# Patient Record
Sex: Female | Born: 1970 | Race: Black or African American | Hispanic: No | Marital: Married | Smoking: Never smoker
Health system: Southern US, Community
[De-identification: ages and names within clinical notes are randomized; demographics above are authoritative.]

## PROBLEM LIST (undated history)

## (undated) DIAGNOSIS — E669 Obesity, unspecified: Secondary | ICD-10-CM

## (undated) DIAGNOSIS — R9089 Other abnormal findings on diagnostic imaging of central nervous system: Secondary | ICD-10-CM

## (undated) DIAGNOSIS — F32A Depression, unspecified: Secondary | ICD-10-CM

## (undated) DIAGNOSIS — J309 Allergic rhinitis, unspecified: Secondary | ICD-10-CM

## (undated) DIAGNOSIS — G4700 Insomnia, unspecified: Secondary | ICD-10-CM

## (undated) DIAGNOSIS — N979 Female infertility, unspecified: Secondary | ICD-10-CM

## (undated) DIAGNOSIS — Q211 Atrial septal defect: Secondary | ICD-10-CM

## (undated) DIAGNOSIS — F329 Major depressive disorder, single episode, unspecified: Secondary | ICD-10-CM

## (undated) DIAGNOSIS — E785 Hyperlipidemia, unspecified: Secondary | ICD-10-CM

## (undated) DIAGNOSIS — M1990 Unspecified osteoarthritis, unspecified site: Secondary | ICD-10-CM

## (undated) DIAGNOSIS — Q2112 Patent foramen ovale: Secondary | ICD-10-CM

## (undated) DIAGNOSIS — N189 Chronic kidney disease, unspecified: Secondary | ICD-10-CM

## (undated) DIAGNOSIS — K219 Gastro-esophageal reflux disease without esophagitis: Secondary | ICD-10-CM

## (undated) DIAGNOSIS — E119 Type 2 diabetes mellitus without complications: Secondary | ICD-10-CM

## (undated) HISTORY — DX: Gastro-esophageal reflux disease without esophagitis: K21.9

## (undated) HISTORY — DX: Female infertility, unspecified: N97.9

## (undated) HISTORY — DX: Hyperlipidemia, unspecified: E78.5

## (undated) HISTORY — DX: Unspecified osteoarthritis, unspecified site: M19.90

## (undated) HISTORY — DX: Depression, unspecified: F32.A

## (undated) HISTORY — DX: Chronic kidney disease, unspecified: N18.9

## (undated) HISTORY — DX: Other abnormal findings on diagnostic imaging of central nervous system: R90.89

## (undated) HISTORY — DX: Insomnia, unspecified: G47.00

## (undated) HISTORY — DX: Patent foramen ovale: Q21.12

## (undated) HISTORY — DX: Allergic rhinitis, unspecified: J30.9

## (undated) HISTORY — DX: Type 2 diabetes mellitus without complications: E11.9

## (undated) HISTORY — DX: Major depressive disorder, single episode, unspecified: F32.9

## (undated) HISTORY — DX: Obesity, unspecified: E66.9

## (undated) HISTORY — DX: Atrial septal defect: Q21.1

---
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
## 1997-11-08 ENCOUNTER — Other Ambulatory Visit: Admission: RE | Admit: 1997-11-08 | Discharge: 1997-11-08 | Payer: Self-pay | Admitting: Obstetrics and Gynecology

## 1998-03-14 ENCOUNTER — Emergency Department (HOSPITAL_COMMUNITY): Admission: EM | Admit: 1998-03-14 | Discharge: 1998-03-14 | Payer: Self-pay | Admitting: Emergency Medicine

## 1998-03-14 ENCOUNTER — Encounter: Payer: Self-pay | Admitting: Emergency Medicine

## 1998-11-11 ENCOUNTER — Other Ambulatory Visit: Admission: RE | Admit: 1998-11-11 | Discharge: 1998-11-11 | Payer: Self-pay | Admitting: Obstetrics and Gynecology

## 1999-04-25 ENCOUNTER — Emergency Department (HOSPITAL_COMMUNITY): Admission: EM | Admit: 1999-04-25 | Discharge: 1999-04-25 | Payer: Self-pay | Admitting: Emergency Medicine

## 1999-12-16 ENCOUNTER — Other Ambulatory Visit: Admission: RE | Admit: 1999-12-16 | Discharge: 1999-12-16 | Payer: Self-pay | Admitting: *Deleted

## 2000-12-21 ENCOUNTER — Other Ambulatory Visit: Admission: RE | Admit: 2000-12-21 | Discharge: 2000-12-21 | Payer: Self-pay | Admitting: Obstetrics and Gynecology

## 2002-03-14 ENCOUNTER — Other Ambulatory Visit: Admission: RE | Admit: 2002-03-14 | Discharge: 2002-03-14 | Payer: Self-pay | Admitting: Obstetrics and Gynecology

## 2003-04-29 ENCOUNTER — Other Ambulatory Visit: Admission: RE | Admit: 2003-04-29 | Discharge: 2003-04-29 | Payer: Self-pay | Admitting: Obstetrics and Gynecology

## 2004-02-28 ENCOUNTER — Ambulatory Visit: Payer: Self-pay | Admitting: Internal Medicine

## 2005-03-25 ENCOUNTER — Ambulatory Visit: Payer: Self-pay | Admitting: Family Medicine

## 2005-05-25 ENCOUNTER — Ambulatory Visit: Payer: Self-pay | Admitting: Internal Medicine

## 2005-06-25 ENCOUNTER — Ambulatory Visit: Payer: Self-pay | Admitting: Internal Medicine

## 2006-03-14 ENCOUNTER — Ambulatory Visit: Payer: Self-pay | Admitting: Gastroenterology

## 2006-12-13 ENCOUNTER — Ambulatory Visit: Payer: Self-pay | Admitting: Internal Medicine

## 2006-12-13 DIAGNOSIS — J45909 Unspecified asthma, uncomplicated: Secondary | ICD-10-CM | POA: Insufficient documentation

## 2006-12-19 LAB — CONVERTED CEMR LAB
ALT: 14 units/L (ref 0–35)
AST: 22 units/L (ref 0–37)
BUN: 7 mg/dL (ref 6–23)
Basophils Absolute: 0 10*3/uL (ref 0.0–0.1)
Basophils Relative: 0.8 % (ref 0.0–1.0)
CO2: 31 meq/L (ref 19–32)
Calcium: 9.5 mg/dL (ref 8.4–10.5)
Chloride: 105 meq/L (ref 96–112)
Cholesterol: 203 mg/dL (ref 0–200)
Creatinine, Ser: 0.8 mg/dL (ref 0.4–1.2)
Direct LDL: 129.7 mg/dL
Eosinophils Absolute: 0.1 10*3/uL (ref 0.0–0.6)
Eosinophils Relative: 1.1 % (ref 0.0–5.0)
GFR calc Af Amer: 106 mL/min
GFR calc non Af Amer: 87 mL/min
Glucose, Bld: 82 mg/dL (ref 70–99)
HCT: 34.6 % — ABNORMAL LOW (ref 36.0–46.0)
Hemoglobin: 11.8 g/dL — ABNORMAL LOW (ref 12.0–15.0)
Lymphocytes Relative: 24.4 % (ref 12.0–46.0)
MCHC: 34.1 g/dL (ref 30.0–36.0)
MCV: 92.4 fL (ref 78.0–100.0)
Monocytes Absolute: 0.3 10*3/uL (ref 0.2–0.7)
Monocytes Relative: 5 % (ref 3.0–11.0)
Neutro Abs: 4.1 10*3/uL (ref 1.4–7.7)
Neutrophils Relative %: 68.7 % (ref 43.0–77.0)
Platelets: 309 10*3/uL (ref 150–400)
Potassium: 4.4 meq/L (ref 3.5–5.1)
RBC: 3.74 M/uL — ABNORMAL LOW (ref 3.87–5.11)
RDW: 12.1 % (ref 11.5–14.6)
Sodium: 141 meq/L (ref 135–145)
TSH: 1.47 microintl units/mL (ref 0.35–5.50)
WBC: 6 10*3/uL (ref 4.5–10.5)

## 2007-04-19 ENCOUNTER — Ambulatory Visit: Payer: Self-pay | Admitting: Internal Medicine

## 2007-04-19 DIAGNOSIS — F519 Sleep disorder not due to a substance or known physiological condition, unspecified: Secondary | ICD-10-CM | POA: Insufficient documentation

## 2007-04-20 DIAGNOSIS — F329 Major depressive disorder, single episode, unspecified: Secondary | ICD-10-CM

## 2007-09-11 LAB — CONVERTED CEMR LAB: Pap Smear: NORMAL

## 2008-03-22 ENCOUNTER — Telehealth (INDEPENDENT_AMBULATORY_CARE_PROVIDER_SITE_OTHER): Payer: Self-pay | Admitting: *Deleted

## 2008-09-11 ENCOUNTER — Ambulatory Visit: Payer: Self-pay | Admitting: Internal Medicine

## 2008-09-11 DIAGNOSIS — K219 Gastro-esophageal reflux disease without esophagitis: Secondary | ICD-10-CM

## 2008-09-18 ENCOUNTER — Ambulatory Visit: Payer: Self-pay | Admitting: Internal Medicine

## 2008-09-18 LAB — CONVERTED CEMR LAB: Hgb A1c MFr Bld: 6 % (ref 4.6–6.5)

## 2008-09-24 ENCOUNTER — Telehealth (INDEPENDENT_AMBULATORY_CARE_PROVIDER_SITE_OTHER): Payer: Self-pay | Admitting: *Deleted

## 2008-09-24 LAB — CONVERTED CEMR LAB
ALT: 17 units/L (ref 0–35)
AST: 24 units/L (ref 0–37)
BUN: 10 mg/dL (ref 6–23)
Basophils Absolute: 0 10*3/uL (ref 0.0–0.1)
Basophils Relative: 0.4 % (ref 0.0–3.0)
CO2: 28 meq/L (ref 19–32)
Calcium: 8.7 mg/dL (ref 8.4–10.5)
Chloride: 112 meq/L (ref 96–112)
Cholesterol: 185 mg/dL (ref 0–200)
Creatinine, Ser: 0.9 mg/dL (ref 0.4–1.2)
Eosinophils Absolute: 0.1 10*3/uL (ref 0.0–0.7)
Eosinophils Relative: 1.5 % (ref 0.0–5.0)
GFR calc non Af Amer: 91.11 mL/min (ref >60)
Glucose, Bld: 106 mg/dL — ABNORMAL HIGH (ref 70–99)
HCT: 32.7 % — ABNORMAL LOW (ref 36.0–46.0)
HDL: 44.7 mg/dL (ref >39.00)
Hemoglobin: 11.4 g/dL — ABNORMAL LOW (ref 12.0–15.0)
LDL Cholesterol: 122 mg/dL — ABNORMAL HIGH (ref 0–99)
Lymphocytes Relative: 24.2 % (ref 12.0–46.0)
Lymphs Abs: 1.1 10*3/uL (ref 0.7–4.0)
MCHC: 34.9 g/dL (ref 30.0–36.0)
MCV: 91.6 fL (ref 78.0–100.0)
Monocytes Absolute: 0.5 10*3/uL (ref 0.1–1.0)
Monocytes Relative: 10.5 % (ref 3.0–12.0)
Neutro Abs: 2.8 10*3/uL (ref 1.4–7.7)
Neutrophils Relative %: 63.4 % (ref 43.0–77.0)
Platelets: 280 10*3/uL (ref 150.0–400.0)
Potassium: 4 meq/L (ref 3.5–5.1)
RBC: 3.57 M/uL — ABNORMAL LOW (ref 3.87–5.11)
RDW: 13.2 % (ref 11.5–14.6)
Sodium: 141 meq/L (ref 135–145)
TSH: 2.09 microintl units/mL (ref 0.35–5.50)
Total CHOL/HDL Ratio: 4
Triglycerides: 94 mg/dL (ref 0.0–149.0)
VLDL: 18.8 mg/dL (ref 0.0–40.0)
WBC: 4.5 10*3/uL (ref 4.5–10.5)

## 2008-10-15 ENCOUNTER — Telehealth: Payer: Self-pay | Admitting: Internal Medicine

## 2008-10-21 ENCOUNTER — Ambulatory Visit: Payer: Self-pay | Admitting: Internal Medicine

## 2008-10-24 LAB — CONVERTED CEMR LAB
Ferritin: 55.9 ng/mL (ref 10.0–291.0)
Folate: 17.5 ng/mL
Iron: 53 ug/dL (ref 42–145)
Vitamin B-12: 430 pg/mL (ref 211–911)

## 2008-11-28 ENCOUNTER — Telehealth (INDEPENDENT_AMBULATORY_CARE_PROVIDER_SITE_OTHER): Payer: Self-pay | Admitting: *Deleted

## 2008-11-28 ENCOUNTER — Ambulatory Visit: Payer: Self-pay | Admitting: Family Medicine

## 2008-11-28 DIAGNOSIS — J019 Acute sinusitis, unspecified: Secondary | ICD-10-CM

## 2009-02-14 ENCOUNTER — Ambulatory Visit: Payer: Self-pay | Admitting: Family

## 2009-02-14 DIAGNOSIS — K5900 Constipation, unspecified: Secondary | ICD-10-CM | POA: Insufficient documentation

## 2009-02-14 DIAGNOSIS — R51 Headache: Secondary | ICD-10-CM

## 2009-02-14 DIAGNOSIS — R519 Headache, unspecified: Secondary | ICD-10-CM | POA: Insufficient documentation

## 2010-08-28 NOTE — Assessment & Plan Note (Signed)
[facility]                         GASTROENTEROLOGY OFFICE NOTE   NAME:[name], [name]                        MRN:          [id number]  DATE:03/14/2006                            DOB:          1970-10-14    REFERRING PHYSICIAN:  Sidney Ace, M.D. [facility]   REASON FOR REFERRAL:  Dr. [place] Callas asked me to evaluate [name] in  consultation regarding asthma, question GERD contributions to her  asthma.   HISTORY OF PRESENT ILLNESS:  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  She  describes pyrosis in her chest, working up to her mouth, an acid taste  in her mouth.  She has some dyspepsia.  She was put on Protonix  approximately two years ago and since then, her GERD symptoms have been  under very good control.  If she takes this regularly, she notices no  pyrosis or acid taste in her mouth.  She has no dysphagia.  She has  stopped the PPI occasionally for a few days or even up to a month at one  time and did notice that her GERD symptoms did begin to return.  She was  sent here by her allergist, wondering if some of her recent asthma  symptoms were extra-esophageal reflux related.   REVIEW OF SYSTEMS:  Notable for stable weight.  She fluctuates quite a  bit but over the past year is approximately stable.  The rest of her  review of systems is essentially normal and is available on her nursing  intake sheet.   PAST MEDICAL HISTORY:  Obesity.  Asthma.   CURRENT MEDICATIONS:  __________, phentermine, multivitamin, calcium,  Claritin, Singulair, Advair, iron, Ginkgo biloba.   SOCIAL HISTORY:  Single.  Lives by herself.  Works as a Database administrator.  Nonsmoker.  Drinks alcohol regularly.  Drinks one medium  [employer] coffee daily as well as 2-3 caffeinated teas daily.  Eats  quite a lot of peppermint on a daily basis and chocolate every other  day.   FAMILY HISTORY:  Father with heart  disease.  No colon cancer or colitis  in the family.   PHYSICAL EXAMINATION:  VITAL SIGNS:  Height 5 feet 5 inches.  Weight 279  pounds, which is a calculated BMI of 46.  Blood pressure 124/74, pulse  86.  CONSTITUTIONAL:  Morbidly obese, otherwise well-appearing.  NEUROLOGIC:  Alert and oriented x3.  HEENT:  Eyes:  Extraocular muscles are intact.  Mouth:  Oropharynx is  moist.  No lesions.  NECK:  Supple.  No lymphadenopathy.  CARDIOVASCULAR:  Heart has a regular rate and rhythm.  LUNGS:  Clear to auscultation bilaterally.  ABDOMEN:  Soft, nontender, nondistended.  Normal bowel sounds.  EXTREMITIES:  No lower extremity edema.  SKIN:  No rashes or lesions on visible extremities.   ASSESSMENT/PLAN:  A 40 year old woman with morbid obesity,  gastroesophageal reflux disease, question extra-esophageal  manifestations of gastroesophageal reflux disease.  [name] almost  certainly has gastroesophageal reflux disease, given her pyrosis, which  responds very  well to proton pump inhibitor taken on a daily basis.  Her  asthma has been worse lately, and there is question of whether this  represents an extra-esophageal manifestation of her gastroesophageal  reflux disease.  She does drink several caffeinated beverages a day and  eats a lot of peppermint, both of which can decrease a tone of lowered  esophageal sphincter, so I recommended that she try to cut back or stop  as best she can.  She also takes her Protonix, not at the perfect timing  in relation to food, so she will begin taking it 20-30 minutes prior to  her breakfast meal.  I explained that her obesity may also be  contributing to her gastroesophageal reflux disease.   I will arrange for her to have an EGD done as soon as convenient to see  if she has had any damage from her chronic GERD.  It can take 2-3 months  before noticing any difference in extra-esophageal manifestation after  complete acid control is gained.      Rachael Fee, MD  Electronically Signed    DPJ/MedQ  DD: 03/14/2006  DT: 03/15/2006  Job #: [id number]   cc:   Sidney Ace, M.D. [facility]

## 2010-10-26 ENCOUNTER — Ambulatory Visit (INDEPENDENT_AMBULATORY_CARE_PROVIDER_SITE_OTHER): Payer: BC Managed Care – PPO | Admitting: Internal Medicine

## 2010-10-26 ENCOUNTER — Encounter: Payer: Self-pay | Admitting: Internal Medicine

## 2010-10-26 DIAGNOSIS — Z0000 Encounter for general adult medical examination without abnormal findings: Secondary | ICD-10-CM

## 2010-10-26 NOTE — Progress Notes (Signed)
  Subjective:    Patient ID: Lauren Johns, female    DOB: 06-21-1970, 40 y.o.   MRN: [id number]  HPI CPX Got married 4- 2012, lost weight before wedding, now gaining it back but just restarted a healthier diet-exercise  Abnormal labs found during insurance physical: increased LFTs?? Cholesterol?  Past Medical History  Diagnosis Date  . Asthma   . Depression     h/o   . GERD (gastroesophageal reflux disease)   . Insomnia   . Obesity   . PFO (patent foramen ovale)     ??PFO repair:  in the 90s, was seen several times after and released (per patient)   Past Surgical History  Procedure Date  . Closed hearty valve repair 1993    [facility] , PFO repair??   Family History  Problem Relation Age of Onset  . Heart attack Father 46  . Diabetes Mother   . Hypertension Mother     M, F  . Prostate cancer Maternal Grandfather   . Colon cancer Neg Hx   . Breast cancer Neg Hx    History   Social History  . Marital Status: Single    Spouse Name: N/A    Number of Children: 0  . Years of Education: N/A   Occupational History  . school teacher     Social History Main Topics  . Smoking status: Never Smoker   . Smokeless tobacco: Not on file  . Alcohol Use: Yes     socially   . Drug Use: No  . Sexually Active: Not on file   Other Topics Concern  . Not on file   Social History Narrative   Exercise- 3 x a week --- diet: did very well before her recent weading, going back now to a healthy diet---mother is Lauren Johns, one of my patients      Review of Systems  Constitutional: Negative for fever and fatigue.  Respiratory: Negative for cough, shortness of breath and wheezing.   Cardiovascular: Negative for chest pain, palpitations and leg swelling.  Gastrointestinal: Negative for blood in stool and abdominal distention.  Genitourinary: Negative for hematuria and difficulty urinating.  Psychiatric/Behavioral:       No anxiety-depression       Objective:   Physical Exam  Constitutional: She is oriented to person, place, and time. She appears well-developed. No distress.       Overweight appearing  HENT:  Head: Normocephalic and atraumatic.  Eyes: No scleral icterus.  Neck: No thyromegaly present.  Cardiovascular: Normal rate and normal heart sounds.   No murmur heard. Pulmonary/Chest: Effort normal and breath sounds normal. No respiratory distress. She has no wheezes. She has no rales.  Abdominal: Soft. Bowel sounds are normal. She exhibits no distension. There is no tenderness. There is no rebound and no guarding.  Musculoskeletal: She exhibits no edema.  Neurological: She is alert and oriented to person, place, and time.  Skin: Skin is warm and dry. She is not diaphoretic.  Psychiatric: She has a normal mood and affect. Her behavior is normal. Judgment and thought content normal.          Assessment & Plan:

## 2010-10-26 NOTE — Assessment & Plan Note (Addendum)
Td 09 Recent abnormal labs per pt, rec to bring reports  Diet, Exericse! Sees gyn Schedule labs, See instructions

## 2010-10-26 NOTE — Patient Instructions (Signed)
Please came back fasting within1 week: FLP, CMP, TSH, CBC-----dx V70

## 2010-10-27 ENCOUNTER — Other Ambulatory Visit: Payer: Self-pay | Admitting: Internal Medicine

## 2010-10-27 DIAGNOSIS — Z0000 Encounter for general adult medical examination without abnormal findings: Secondary | ICD-10-CM

## 2010-10-28 ENCOUNTER — Other Ambulatory Visit (INDEPENDENT_AMBULATORY_CARE_PROVIDER_SITE_OTHER): Payer: BC Managed Care – PPO

## 2010-10-28 DIAGNOSIS — Z0000 Encounter for general adult medical examination without abnormal findings: Secondary | ICD-10-CM

## 2010-10-28 LAB — BASIC METABOLIC PANEL
CO2: 26 mEq/L (ref 19–32)
Calcium: 8.6 mg/dL (ref 8.4–10.5)
Chloride: 108 mEq/L (ref 96–112)
Creatinine, Ser: 0.9 mg/dL (ref 0.4–1.2)
Glucose, Bld: 95 mg/dL (ref 70–99)
Sodium: 141 mEq/L (ref 135–145)

## 2010-10-28 LAB — CBC WITH DIFFERENTIAL/PLATELET
Eosinophils Absolute: 0.1 10*3/uL (ref 0.0–0.7)
Eosinophils Relative: 1.3 % (ref 0.0–5.0)
HCT: 34.6 % — ABNORMAL LOW (ref 36.0–46.0)
Lymphs Abs: 1.1 10*3/uL (ref 0.7–4.0)
MCHC: 33.9 g/dL (ref 30.0–36.0)
MCV: 93.7 fl (ref 78.0–100.0)
Monocytes Absolute: 0.6 10*3/uL (ref 0.1–1.0)
Platelets: 255 10*3/uL (ref 150.0–400.0)
RDW: 14.4 % (ref 11.5–14.6)
WBC: 4.9 10*3/uL (ref 4.5–10.5)

## 2010-10-28 LAB — HEPATIC FUNCTION PANEL
ALT: 39 U/L — ABNORMAL HIGH (ref 0–35)
Albumin: 4.1 g/dL (ref 3.5–5.2)
Alkaline Phosphatase: 66 U/L (ref 39–117)
Total Protein: 7.3 g/dL (ref 6.0–8.3)

## 2010-10-28 LAB — LIPID PANEL
Cholesterol: 219 mg/dL — ABNORMAL HIGH (ref 0–200)
Triglycerides: 76 mg/dL (ref 0.0–149.0)

## 2010-10-28 LAB — TSH: TSH: 2.15 u[IU]/mL (ref 0.35–5.50)

## 2010-10-28 NOTE — Progress Notes (Signed)
Labs only

## 2010-11-03 ENCOUNTER — Telehealth: Payer: Self-pay | Admitting: *Deleted

## 2010-11-03 NOTE — Telephone Encounter (Signed)
Noted, will recheck when she comes back in 6 months

## 2010-11-03 NOTE — Telephone Encounter (Signed)
Message left for patient to return my call. Needs to come back for labs

## 2010-11-03 NOTE — Telephone Encounter (Signed)
Spoke w/ pt aware of labs and says that anemia isn't a new problem takes iron supplements daily.

## 2010-11-03 NOTE — Telephone Encounter (Signed)
Message copied by Leanne Lovely on Tue Nov 03, 2010 11:07 AM ------      Message from: Willow Ora E      Created: Mon Nov 02, 2010  5:48 PM       Advise patient      Her bad cholesterol is 172, high: The treatment for now is a healthy diet, daily exercise and lose weight. Recheck in 6 months.       She has anemia, please add iron, ferritin--- DX anemia      One of her LFTs (ALT) is slightly elevated, I see no reason to be concerned. We need to repeat yearly.      Again we need to recheck her FLP, please arrange for office visit in 6 months

## 2011-03-18 ENCOUNTER — Ambulatory Visit (INDEPENDENT_AMBULATORY_CARE_PROVIDER_SITE_OTHER): Payer: BC Managed Care – PPO

## 2011-03-18 DIAGNOSIS — Z23 Encounter for immunization: Secondary | ICD-10-CM

## 2011-05-10 ENCOUNTER — Other Ambulatory Visit: Payer: Self-pay | Admitting: Sports Medicine

## 2011-05-10 DIAGNOSIS — M5416 Radiculopathy, lumbar region: Secondary | ICD-10-CM

## 2011-05-13 ENCOUNTER — Ambulatory Visit
Admission: RE | Admit: 2011-05-13 | Discharge: 2011-05-13 | Disposition: A | Discharge status: Home / Self Care | Payer: BC Managed Care – PPO | Source: Ambulatory Visit | Attending: Sports Medicine | Admitting: Sports Medicine

## 2011-05-13 DIAGNOSIS — M5416 Radiculopathy, lumbar region: Secondary | ICD-10-CM

## 2011-10-11 ENCOUNTER — Ambulatory Visit (INDEPENDENT_AMBULATORY_CARE_PROVIDER_SITE_OTHER): Payer: BC Managed Care – PPO | Admitting: Internal Medicine

## 2011-10-11 ENCOUNTER — Encounter: Payer: Self-pay | Admitting: Internal Medicine

## 2011-10-11 VITALS — BP 118/82 | HR 73 | Temp 98.1°F | Ht 65.0 in | Wt 308.0 lb

## 2011-10-11 DIAGNOSIS — E669 Obesity, unspecified: Secondary | ICD-10-CM

## 2011-10-11 DIAGNOSIS — L708 Other acne: Secondary | ICD-10-CM

## 2011-10-11 DIAGNOSIS — L709 Acne, unspecified: Secondary | ICD-10-CM | POA: Insufficient documentation

## 2011-10-11 DIAGNOSIS — Q211 Atrial septal defect: Secondary | ICD-10-CM

## 2011-10-11 MED ORDER — CLINDAMYCIN PHOSPHATE 1 % EX GEL: Freq: Two times a day (BID) | CUTANEOUS | Status: AC

## 2011-10-11 NOTE — Patient Instructions (Addendum)
Come back fasting: FLP, CMP, TSH-- dx 278.00 Hemoglobin A1c-- dx  hyperglycemia

## 2011-10-11 NOTE — Assessment & Plan Note (Signed)
?   PFO repair in the 90s;  now facing possible surgery EKG at baseline Plan: Echocardiogram

## 2011-10-11 NOTE — Assessment & Plan Note (Signed)
Mild acne since she discontinued birth control pills. Prescribed Clindagel

## 2011-10-11 NOTE — Assessment & Plan Note (Signed)
Long history of obesity, in the past she has tried several diets with mixed results including Weight Watchers, personal trainers, protein shakes, "just cut down". Weight review 2008 281 pounds, 2009 275 pounds, 2002 181 pounds, 10/22/2011 293, today 308  Pounds She has mild hyperlipidemia and hyperglycemia. Plan: General labs including an A1c. Letter of support to surgery

## 2011-10-11 NOTE — Progress Notes (Signed)
  Subjective:    Patient ID: Lauren Johns, female    DOB: 20-Apr-1970, 41 y.o.   MRN: [id number]  HPI Here for the following issues: Obesity, thinking about having bariatric surgery, needs a letter completed for her surgeons. Also, recently discontinued birth control pills as she desires to get pregnant. Has noted acne coming back.  Past Medical History  Diagnosis Date  . Asthma   . Depression     h/o   . GERD (gastroesophageal reflux disease)   . Insomnia   . Obesity   . PFO (patent foramen ovale)     ??PFO repair:  in the 90s, was seen several times after and released (per patient)   Past Surgical History  Procedure Date  . Closed hearty valve repair 1993    [facility] , PFO repair??   History   Social History  . Marital Status: Married    Spouse Name: N/A    Number of Children: 0  . Years of Education: N/A   Occupational History  . school teacher     Social History Main Topics  . Smoking status: Never Smoker   . Smokeless tobacco: Never Used  . Alcohol Use: Yes     socially   . Drug Use: No  . Sexually Active: Not on file   Other Topics Concern  . Not on file   Social History Narrative   Exercise: just restarted to be active---Diet:healthier lately mother is Debera Lat, one of my patients    Family History  Problem Relation Age of Onset  . Heart attack Father 71  . Diabetes Mother   . Hypertension Mother     M, F  . Prostate cancer Maternal Grandfather   . Colon cancer Neg Hx   . Breast cancer Neg Hx       Review of Systems In general feeling well. As far as her diet, has not been good up until recently when she tried to eat healthier. Exercise, she has been pretty inactive until today when she started back exercising with a trainer. Denies chest pain or shortness of breath Denies nausea, vomiting, diarrhea. No blood in the stools. No anxiety depression.     Objective:   Physical Exam  General -- alert, well-developed, and morbidly  overweight . No apparent distress.  Neck --no thyromegaly HEENT --  few acne lesions at the forehead and between the eyes. Lungs -- normal respiratory effort, no intercostal retractions, no accessory muscle use, and normal breath sounds.   Heart-- normal rate, regular rhythm, no murmur, and no gallop.   Abdomen--soft, non-tender, no distention, no masses, no HSM, no guarding, and no rigidity.   Extremities-- no pretibial edema bilaterally  Neurologic-- alert & oriented X3 and strength normal in all extremities. Psych-- Cognition and judgment appear intact. Alert and cooperative with normal attention span and concentration.  not anxious appearing and not depressed appearing.       Assessment & Plan:

## 2011-10-12 ENCOUNTER — Telehealth: Payer: Self-pay | Admitting: Internal Medicine

## 2011-10-12 NOTE — Telephone Encounter (Signed)
Called, no MD available

## 2011-10-12 NOTE — Telephone Encounter (Signed)
In reference to order for Echo, [employer] is requiring a Peer to Peer review.  Please call [phone], Option 2, ref ID [id number].

## 2011-10-13 ENCOUNTER — Other Ambulatory Visit: Payer: Self-pay | Admitting: *Deleted

## 2011-10-13 DIAGNOSIS — Q211 Atrial septal defect: Secondary | ICD-10-CM

## 2011-10-13 NOTE — Telephone Encounter (Signed)
authrozation #: [id number]

## 2011-10-13 NOTE — Addendum Note (Signed)
Addended by: Edwena Felty T on: 10/13/2011 03:45 PM   Modules accepted: Orders

## 2011-10-15 ENCOUNTER — Other Ambulatory Visit (INDEPENDENT_AMBULATORY_CARE_PROVIDER_SITE_OTHER): Payer: BC Managed Care – PPO

## 2011-10-15 DIAGNOSIS — R739 Hyperglycemia, unspecified: Secondary | ICD-10-CM

## 2011-10-15 DIAGNOSIS — R7309 Other abnormal glucose: Secondary | ICD-10-CM

## 2011-10-15 DIAGNOSIS — E669 Obesity, unspecified: Secondary | ICD-10-CM

## 2011-10-15 LAB — COMPREHENSIVE METABOLIC PANEL
AST: 23 U/L (ref 0–37)
Albumin: 4 g/dL (ref 3.5–5.2)
Alkaline Phosphatase: 66 U/L (ref 39–117)
BUN: 13 mg/dL (ref 6–23)
Calcium: 9.5 mg/dL (ref 8.4–10.5)
Chloride: 106 mEq/L (ref 96–112)
Glucose, Bld: 96 mg/dL (ref 70–99)
Potassium: 4 mEq/L (ref 3.5–5.1)
Sodium: 141 mEq/L (ref 135–145)
Total Protein: 7.3 g/dL (ref 6.0–8.3)

## 2011-10-15 LAB — HEMOGLOBIN A1C: Hgb A1c MFr Bld: 6.2 % (ref 4.6–6.5)

## 2011-10-15 LAB — TSH: TSH: 2.03 u[IU]/mL (ref 0.35–5.50)

## 2011-10-15 NOTE — Progress Notes (Signed)
Labs only

## 2011-10-20 ENCOUNTER — Encounter: Payer: Self-pay | Admitting: Internal Medicine

## 2011-10-20 ENCOUNTER — Other Ambulatory Visit (HOSPITAL_COMMUNITY): Payer: BC Managed Care – PPO

## 2011-10-20 ENCOUNTER — Encounter: Payer: Self-pay | Admitting: *Deleted

## 2011-11-02 ENCOUNTER — Other Ambulatory Visit (HOSPITAL_COMMUNITY): Payer: BC Managed Care – PPO

## 2011-11-08 ENCOUNTER — Ambulatory Visit (HOSPITAL_COMMUNITY): Payer: BC Managed Care – PPO | Attending: Cardiology

## 2011-11-08 DIAGNOSIS — I059 Rheumatic mitral valve disease, unspecified: Secondary | ICD-10-CM | POA: Insufficient documentation

## 2011-11-08 DIAGNOSIS — I079 Rheumatic tricuspid valve disease, unspecified: Secondary | ICD-10-CM | POA: Insufficient documentation

## 2011-11-08 DIAGNOSIS — E669 Obesity, unspecified: Secondary | ICD-10-CM | POA: Insufficient documentation

## 2011-11-08 DIAGNOSIS — Q2111 Secundum atrial septal defect: Secondary | ICD-10-CM

## 2011-11-08 DIAGNOSIS — Q211 Atrial septal defect: Secondary | ICD-10-CM | POA: Insufficient documentation

## 2011-11-08 DIAGNOSIS — I319 Disease of pericardium, unspecified: Secondary | ICD-10-CM | POA: Insufficient documentation

## 2011-11-08 DIAGNOSIS — I379 Nonrheumatic pulmonary valve disorder, unspecified: Secondary | ICD-10-CM | POA: Insufficient documentation

## 2011-11-08 NOTE — Progress Notes (Signed)
Echocardiogram performed.  

## 2011-11-09 ENCOUNTER — Encounter: Payer: Self-pay | Admitting: *Deleted

## 2011-11-17 ENCOUNTER — Ambulatory Visit (INDEPENDENT_AMBULATORY_CARE_PROVIDER_SITE_OTHER): Payer: BC Managed Care – PPO | Admitting: General Surgery

## 2011-11-17 ENCOUNTER — Encounter (INDEPENDENT_AMBULATORY_CARE_PROVIDER_SITE_OTHER): Payer: Self-pay | Admitting: General Surgery

## 2011-11-17 VITALS — BP 132/68 | HR 64 | Temp 97.2°F | Resp 16 | Ht 65.0 in | Wt 306.4 lb

## 2011-11-17 DIAGNOSIS — Z6841 Body Mass Index (BMI) 40.0 and over, adult: Secondary | ICD-10-CM

## 2011-11-17 NOTE — Progress Notes (Signed)
Patient ID: Lauren Johns, female   DOB: [birth year], 41 y.o.   MRN: [id number]  Chief Complaint  Patient presents with  . Other    lap band initial    HPI Lauren Johns is a 41 y.o. female.   HPI 41 year old morbidly obese African American female referred by Dr. Drue Novel for evaluation for weight loss surgery. The patient is specifically interested in laparoscopic adjustable gastric band surgery. She likes the fact that the weight loss is more gradual. Moreover she has several friends who had complications with gastric bypass surgery. She states that she is from all of her life to  Lose weight. She has tried to [employer], [employer]ystem, [employer], as well as a low calorie diet call without any long-term success. Her most successful program was with [employer] in high school when she lost approximately 60 pounds. She also has been on phentermine for one year without any success. She has recently signed up for boot camp which is going to be starting up next week.   Past Medical History  Diagnosis Date  . Asthma   . Depression     h/o   . GERD (gastroesophageal reflux disease)   . Insomnia   . Obesity   . PFO (patent foramen ovale)     ??PFO repair:  in the 90s, was seen several times after and released (per patient)  . Arthritis   . Chronic kidney disease - not sure of this dx - has nml Cr on recent labs   . Elevated blood sugar   . Elevated lipids     Past Surgical History  Procedure Date  . Closed hearty valve repair 1993    [facility] , PFO repair??    Family History  Problem Relation Age of Onset  . Heart attack Father 46  . Heart disease Father   . Diabetes Mother   . Hypertension Mother     M, F  . Prostate cancer Maternal Grandfather   . Cancer Maternal Grandfather     colon  . Colon cancer Neg Hx   . Breast cancer Neg Hx     Social History History  Substance Use Topics  . Smoking status: Never Smoker   . Smokeless tobacco: Never Used  . Alcohol Use: Yes   socially     Allergies  Allergen Reactions  . Penicillins     REACTION: HIVES    Current Outpatient Prescriptions  Medication Sig Dispense Refill  . acetaminophen (TYLENOL) 500 MG tablet Take 1,000 mg by mouth every 6 (six) hours as needed.        . clindamycin (CLINDAGEL) 1 % gel Apply topically 2 (two) times daily.  30 g  3  . QVAR 80 MCG/ACT inhaler Inhale 80 mcg into the lungs Daily.      . SYMBICORT 80-4.5 MCG/ACT inhaler       . tretinoin (RETIN-A) 0.025 % cream       . VENTOLIN HFA 108 (90 BASE) MCG/ACT inhaler Inhale 90 mcg into the lungs Daily.        Review of Systems Review of Systems  Constitutional: Negative for fever, activity change, appetite change, fatigue and unexpected weight change.       A1C was 6.2  HENT: Positive for postnasal drip. Negative for hearing loss, nosebleeds and neck pain.   Eyes: Negative for photophobia, redness and visual disturbance.  Respiratory: Negative for apnea, chest tightness, shortness of breath and wheezing.  Uses inhaler daily - no hospitalizations for asthma  Cardiovascular: Negative for chest pain, palpitations and leg swelling.       Denies CP, SOB, PND, orthopnea. Sometimes asthma will flare causing SOB. Had a patent 'heart valve' - repaired via groin.   Gastrointestinal: Negative for nausea, vomiting, abdominal pain, diarrhea and constipation.       Rare reflux  Genitourinary: Positive for menstrual problem (crampy, heavy flow since stopped OCP). Negative for dysuria, hematuria and difficulty urinating.       G0P0  Musculoskeletal: Negative for myalgias and back pain.       Bulging disc in back seen on MRI  Neurological: Negative for tremors, seizures, speech difficulty, weakness, light-headedness and headaches.       Denies TIA, amaurosis fugax  Hematological: Negative for adenopathy. Does not bruise/bleed easily.       Low grade anemia on recent blood work - PCP thinks due to menses  Psychiatric/Behavioral: Negative  for confusion. The patient is not nervous/anxious.     Blood pressure 132/68, pulse 64, temperature 97.2 F (36.2 C), temperature source Temporal, resp. rate 16, height 5\' 5"  (1.651 m), weight 306 lb 6.4 oz (138.982 kg), last menstrual period 11/12/2011.  Physical Exam Physical Exam  Vitals reviewed. Constitutional: She is oriented to person, place, and time. She appears well-developed and well-nourished. No distress.       Morbidly obese  HENT:  Head: Normocephalic and atraumatic.  Right Ear: External ear normal.  Left Ear: External ear normal.  Nose: Nose normal.  Eyes: Conjunctivae are normal. No scleral icterus.  Neck: Normal range of motion. Neck supple. No tracheal deviation present. No thyromegaly present.  Cardiovascular: Normal rate, regular rhythm, normal heart sounds and intact distal pulses.   Pulmonary/Chest: Effort normal and breath sounds normal. No respiratory distress. She has no wheezes.  Abdominal: Soft. Bowel sounds are normal. She exhibits no distension. There is no tenderness. There is no guarding.       obese  Musculoskeletal: Normal range of motion. She exhibits no edema and no tenderness.  Lymphadenopathy:    She has no cervical adenopathy.  Neurological: She is alert and oriented to person, place, and time. No cranial nerve deficit.  Skin: Skin is warm and dry. No rash noted. She is not diaphoretic. No erythema.  Psychiatric: She has a normal mood and affect. Her behavior is normal. Judgment and thought content normal.    Data Reviewed Dr Drue Novel note Labs from 10/2011 including lipid panel, CMET, CBC, TSH, A1C - all normal except for hgb 11.7, hct 34.6; A1C 6.2; Total cholesterol 234, TG 136 MRI spine - mild DDD L spine, probable uterine fibroid  Assessment    Morbid obesity BMI 51 Asthma Anemia Elevated Triglycerides Elevated lipids Elevated blood sugar Mild DDD of Lumbar spine     Plan    The patient meets weight loss surgery criteria. I think  the patient would be an acceptable candidate for Laparoscopic adjustable gastric band placement.  We discussed laparoscopic adjustable gastric banding. The patient was given Agricultural engineer. We discussed the risk and benefits of surgery including but not limited to bleeding, infection, injury to surrounding structures, blood clot formation such as deep venous thrombosis or pulmonary embolism, need to convert to an open procedure, band slippage, band erosion, failure to loose weight, port complications (leak or flippage), potential need for reoperative surgery, esophageal dilatation, worsening reflux, and vitamin deficiencies. We discussed the typical post operative recovery course. We discussed that their postoperative  diet will be modified for several weeks. We specifically talked about the need to be on a liquid diet for one to 2 weeks after surgery. We also discussed the typical postoperative course with a laparoscopic adjustable gastric band and the need for frequent postoperative visits to assess the volume status of the band.  We discussed the typical expected weight loss with a laparoscopic adjustable gastric band. I explained to the patient that they can expect to lose 40-60% of their excess body weight if they are compliant with their postoperative instructions. However I did explain that some patients loose less than 40% and some patients lose more than 60% of their excess body weight.  I explained that the likelihood of improvement in their obesity is good.  I explained to the patient that we will start our evaluation process which includes labs (pregnancy, & H pylori), Upper GI to evaluate stomach and swallowing anatomy, nutritionist consultation, psychiatrist consultation, EKG, CXR, abdominal ultrasound.  Mary Sella. Andrey Campanile, MD, FACS General, Bariatric, & Minimally Invasive Surgery [facility], [place]         [facility] M 11/17/2011, 12:56 PM

## 2011-11-17 NOTE — Patient Instructions (Signed)
We will start our work-up for LapBand Surgery

## 2011-12-02 ENCOUNTER — Ambulatory Visit: Payer: BC Managed Care – PPO | Admitting: *Deleted

## 2011-12-03 ENCOUNTER — Ambulatory Visit (HOSPITAL_COMMUNITY): Admission: RE | Admit: 2011-12-03 | Payer: BC Managed Care – PPO | Source: Ambulatory Visit

## 2011-12-03 ENCOUNTER — Ambulatory Visit (HOSPITAL_COMMUNITY): Payer: BC Managed Care – PPO

## 2011-12-03 ENCOUNTER — Ambulatory Visit (HOSPITAL_COMMUNITY): Payer: BC Managed Care – PPO | Attending: General Surgery

## 2012-05-13 ENCOUNTER — Emergency Department (HOSPITAL_BASED_OUTPATIENT_CLINIC_OR_DEPARTMENT_OTHER): Payer: BC Managed Care – PPO

## 2012-05-13 ENCOUNTER — Emergency Department (HOSPITAL_BASED_OUTPATIENT_CLINIC_OR_DEPARTMENT_OTHER)
Admission: EM | Admit: 2012-05-13 | Discharge: 2012-05-13 | Disposition: A | Discharge status: Home / Self Care | Payer: BC Managed Care – PPO | Attending: Emergency Medicine | Admitting: Emergency Medicine

## 2012-05-13 ENCOUNTER — Encounter (HOSPITAL_BASED_OUTPATIENT_CLINIC_OR_DEPARTMENT_OTHER): Payer: Self-pay | Admitting: *Deleted

## 2012-05-13 DIAGNOSIS — E669 Obesity, unspecified: Secondary | ICD-10-CM | POA: Insufficient documentation

## 2012-05-13 DIAGNOSIS — J45901 Unspecified asthma with (acute) exacerbation: Secondary | ICD-10-CM | POA: Insufficient documentation

## 2012-05-13 DIAGNOSIS — R059 Cough, unspecified: Secondary | ICD-10-CM | POA: Insufficient documentation

## 2012-05-13 DIAGNOSIS — Z8719 Personal history of other diseases of the digestive system: Secondary | ICD-10-CM | POA: Insufficient documentation

## 2012-05-13 DIAGNOSIS — R05 Cough: Secondary | ICD-10-CM | POA: Insufficient documentation

## 2012-05-13 DIAGNOSIS — J40 Bronchitis, not specified as acute or chronic: Secondary | ICD-10-CM

## 2012-05-13 DIAGNOSIS — J45909 Unspecified asthma, uncomplicated: Secondary | ICD-10-CM

## 2012-05-13 DIAGNOSIS — Z8774 Personal history of (corrected) congenital malformations of heart and circulatory system: Secondary | ICD-10-CM | POA: Insufficient documentation

## 2012-05-13 DIAGNOSIS — Z79899 Other long term (current) drug therapy: Secondary | ICD-10-CM | POA: Insufficient documentation

## 2012-05-13 DIAGNOSIS — N189 Chronic kidney disease, unspecified: Secondary | ICD-10-CM | POA: Insufficient documentation

## 2012-05-13 DIAGNOSIS — Z8659 Personal history of other mental and behavioral disorders: Secondary | ICD-10-CM | POA: Insufficient documentation

## 2012-05-13 DIAGNOSIS — R509 Fever, unspecified: Secondary | ICD-10-CM | POA: Insufficient documentation

## 2012-05-13 DIAGNOSIS — Z8739 Personal history of other diseases of the musculoskeletal system and connective tissue: Secondary | ICD-10-CM | POA: Insufficient documentation

## 2012-05-13 DIAGNOSIS — IMO0002 Reserved for concepts with insufficient information to code with codable children: Secondary | ICD-10-CM | POA: Insufficient documentation

## 2012-05-13 MED ORDER — PREDNISONE 20 MG PO TABS: ORAL_TABLET | ORAL | Status: DC

## 2012-05-13 MED ORDER — ALBUTEROL SULFATE (5 MG/ML) 0.5% IN NEBU
2.5000 mg | INHALATION_SOLUTION | RESPIRATORY_TRACT | Status: DC
  Administered 2012-05-13: 2.5 mg via RESPIRATORY_TRACT
  Filled 2012-05-13: qty 1

## 2012-05-13 MED ORDER — IPRATROPIUM BROMIDE 0.02 % IN SOLN
0.5000 mg | RESPIRATORY_TRACT | Status: DC
  Administered 2012-05-13: 0.5 mg via RESPIRATORY_TRACT
  Filled 2012-05-13: qty 2.5

## 2012-05-13 MED ORDER — HYDROCODONE-HOMATROPINE 5-1.5 MG/5ML PO SYRP: 5.0000 mL | ORAL_SOLUTION | Freq: Four times a day (QID) | ORAL | Status: DC | PRN

## 2012-05-13 NOTE — ED Notes (Signed)
C/o sore throat that started on Thursday. Was seen Friday at an urgent care and dx with a sinus infection with asthma exacerbation. States no cxray was done. States she has had fevers on and off. Has been treated with zithromax and tessalon perle. States this has not helped with her cough. C/o feeing sob today. States inhaler has not been helping. Faint exp wheezing noted on exam.

## 2012-05-13 NOTE — ED Provider Notes (Signed)
History     CSN: [id number]  Arrival date & time 05/13/12  2207   First MD Initiated Contact with Patient 05/13/12 2234      Chief Complaint  Patient presents with  . Shortness of Breath    (Consider location/radiation/quality/duration/timing/severity/associated sxs/prior treatment) Patient is a 42 y.o. female presenting with shortness of breath.  Shortness of Breath  Associated symptoms include shortness of breath.    Past Medical History  Diagnosis Date  . Asthma   . Depression     h/o   . GERD (gastroesophageal reflux disease)   . Insomnia   . Obesity   . PFO (patent foramen ovale)     ??PFO repair:  in the 90s, was seen several times after and released (per patient)  . Arthritis   . Chronic kidney disease   . Elevated blood sugar   . Elevated lipids     Past Surgical History  Procedure Date  . Closed hearty valve repair 1993    [facility] , PFO repair??    Family History  Problem Relation Age of Onset  . Heart attack Father 73  . Heart disease Father   . Diabetes Mother   . Hypertension Mother     M, F  . Prostate cancer Maternal Grandfather   . Cancer Maternal Grandfather     colon  . Colon cancer Neg Hx   . Breast cancer Neg Hx     History  Substance Use Topics  . Smoking status: Never Smoker   . Smokeless tobacco: Never Used  . Alcohol Use: Yes     Comment: socially     OB History    Grav Para Term Preterm Abortions TAB SAB Ect Mult Living                  Review of Systems  Respiratory: Positive for shortness of breath.     Allergies  Penicillins  Home Medications   Current Outpatient Rx  Name  Route  Sig  Dispense  Refill  . AZITHROMYCIN 250 MG PO TABS   Oral   Take 250 mg by mouth daily.         Marland Kitchen BENZONATATE 100 MG PO CAPS   Oral   Take 100 mg by mouth 3 (three) times daily as needed.         . ACETAMINOPHEN 500 MG PO TABS   Oral   Take 1,000 mg by mouth every 6 (six) hours as needed.           Marland Kitchen CLINDAMYCIN  PHOSPHATE 1 % EX GEL   Topical   Apply topically 2 (two) times daily.   30 g   3   . QVAR 80 MCG/ACT IN AERS   Inhalation   Inhale 80 mcg into the lungs Daily.         . SYMBICORT 80-4.5 MCG/ACT IN AERO               . TRETINOIN 0.025 % EX CREA               . VENTOLIN HFA 108 (90 BASE) MCG/ACT IN AERS   Inhalation   Inhale 90 mcg into the lungs Daily.           BP 147/55  Pulse 74  Temp 99.3 F (37.4 C) (Oral)  Resp 20  Ht 5\' 5"  (1.651 m)  Wt 297 lb (134.718 kg)  BMI 49.42 kg/m2  SpO2 97%  LMP 05/04/2012  Physical Exam  ED Course  Procedures (including critical care time)  Labs Reviewed - No data to display Dg Chest 2 View  05/13/2012  *RADIOLOGY REPORT*  Clinical Data: Chest tightness.  Shortness of breath.  Cough.  CHEST - 2 VIEW  Comparison: None.  Findings: Cardiac enlargement with normal pulmonary vascularity. Postoperative change in the left mediastinum.  Suggests previous PDA closure.  No focal airspace consolidation in the lungs.  No blunting of costophrenic angles.  No pneumothorax.  Mediastinal contours appear intact.  IMPRESSION: No evidence of active pulmonary disease.   Original Report Authenticated By: Burman Nieves, M.D.      Diagnosis: Bronchitis    MDM  Patient with persistent symptoms of respiratory infection and bronchitis. Patient has normal oxygenation. Chest x-ray did not show any acute abnormalities. Patient placed on Zithromax Hycodan and prednisone.        Gilda Crease, MD 05/14/12 [phone]

## 2012-05-13 NOTE — ED Provider Notes (Signed)
History   This chart was scribed for Lauren Crease, MD scribed by Magnus Sinning. The patient was seen in room MH03/MH03 at 22:40  CSN: [id number]  Arrival date & time 05/13/12  2207    Chief Complaint  Patient presents with  . Shortness of Breath    (Consider location/radiation/quality/duration/timing/severity/associated sxs/prior treatment) Patient is a 42 y.o. female presenting with shortness of breath. The history is provided by the patient. No language interpreter was used.  Shortness of Breath  Associated symptoms include a fever, cough and shortness of breath. Pertinent negatives include no sore throat.   Lauren Johns is a 42 y.o. female who presents to the Emergency Department complaining of constant moderate SOB, onset today with associated intermittent fevers, and cough.   The patient states sxs began two days ago as a ST. She said it worsened yesterday morning and so she decided to go to a Urgent Care where she was reportedly diagnosed with a sinus infection and given a steroid injection. She says tonight she started coughing and was unable to catch her breath, which prompted her to present to the ED. She says she has been using an albuterol inhaler, rescue inhaler, breathing treatment, last approx 2 hours ago, and taking rx oral steroid at home, all with minimal relief.   Past Medical History  Diagnosis Date  . Asthma   . Depression     h/o   . GERD (gastroesophageal reflux disease)   . Insomnia   . Obesity   . PFO (patent foramen ovale)     ??PFO repair:  in the 90s, was seen several times after and released (per patient)  . Arthritis   . Chronic kidney disease   . Elevated blood sugar   . Elevated lipids     Past Surgical History  Procedure Date  . Closed hearty valve repair 1993    [facility] , PFO repair??    Family History  Problem Relation Age of Onset  . Heart attack Father 59  . Heart disease Father   . Diabetes Mother   . Hypertension  Mother     M, F  . Prostate cancer Maternal Grandfather   . Cancer Maternal Grandfather     colon  . Colon cancer Neg Hx   . Breast cancer Neg Hx     History  Substance Use Topics  . Smoking status: Never Smoker   . Smokeless tobacco: Never Used  . Alcohol Use: Yes     Comment: socially    Review of Systems  Constitutional: Positive for fever.  HENT: Negative for sore throat.   Respiratory: Positive for cough and shortness of breath.   All other systems reviewed and are negative.    Allergies  Penicillins  Home Medications   Current Outpatient Rx  Name  Route  Sig  Dispense  Refill  . AZITHROMYCIN 250 MG PO TABS   Oral   Take 250 mg by mouth daily.         Marland Kitchen BENZONATATE 100 MG PO CAPS   Oral   Take 100 mg by mouth 3 (three) times daily as needed.         . ACETAMINOPHEN 500 MG PO TABS   Oral   Take 1,000 mg by mouth every 6 (six) hours as needed.           Marland Kitchen CLINDAMYCIN PHOSPHATE 1 % EX GEL   Topical   Apply topically 2 (two) times daily.   30  g   3   . QVAR 80 MCG/ACT IN AERS   Inhalation   Inhale 80 mcg into the lungs Daily.         . SYMBICORT 80-4.5 MCG/ACT IN AERO               . TRETINOIN 0.025 % EX CREA               . VENTOLIN HFA 108 (90 BASE) MCG/ACT IN AERS   Inhalation   Inhale 90 mcg into the lungs Daily.           BP 147/55  Pulse 74  Temp 99.3 F (37.4 C) (Oral)  Resp 20  Ht 5\' 5"  (1.651 m)  Wt 297 lb (134.718 kg)  BMI 49.42 kg/m2  SpO2 97%  LMP 05/04/2012  Physical Exam  Nursing note and vitals reviewed. Constitutional: She is oriented to person, place, and time. She appears well-developed and well-nourished. No distress.  HENT:  Head: Normocephalic and atraumatic.  Eyes: Conjunctivae normal and EOM are normal.  Neck: Neck supple. No tracheal deviation present.  Cardiovascular: Normal rate and regular rhythm.   Pulmonary/Chest: Effort normal. No respiratory distress. She has no wheezes. She has no  rales.  Abdominal: She exhibits no distension.  Musculoskeletal: Normal range of motion.  Neurological: She is alert and oriented to person, place, and time. No sensory deficit.  Skin: Skin is warm and dry.  Psychiatric: She has a normal mood and affect. Her behavior is normal.    ED Course  Procedures (including critical care time) DIAGNOSTIC STUDIES: Oxygen Saturation is 97% on room air, normal by my interpretation.    COORDINATION OF CARE: 22:42: Physical exam performed. Labs Reviewed - No data to display No results found.   Diagnosis: Asthma; Bronchitis     MDM  Patient presents with continued wheezing and shortness of breath. Patient was treated with Zithromax, prednisone and Tessalon Perles earlier in the week. She continues to have asthma. Symptoms. Patient reports persistent cough. She had no wheezing at the time of my examination and oxygenation is normal. Continue Zithromax, and Hycodan and restart prednisone taper. She was put on a rapid low-dose taper and therefore will place her on higher dose prednisone for a longer period of time. Continue all other medications.  I personally performed the services described in this documentation, which was scribed in my presence. The recorded information has been reviewed and is accurate.         Lauren Crease, MD 05/13/12 [id number]

## 2012-05-17 ENCOUNTER — Ambulatory Visit (INDEPENDENT_AMBULATORY_CARE_PROVIDER_SITE_OTHER): Payer: BC Managed Care – PPO | Admitting: Internal Medicine

## 2012-05-17 VITALS — BP 128/80 | HR 53 | Temp 98.8°F | Wt 304.0 lb

## 2012-05-17 DIAGNOSIS — J45909 Unspecified asthma, uncomplicated: Secondary | ICD-10-CM

## 2012-05-17 MED ORDER — HYDROCODONE-HOMATROPINE 5-1.5 MG/5ML PO SYRP: 5.0000 mL | ORAL_SOLUTION | Freq: Four times a day (QID) | ORAL | Status: DC | PRN

## 2012-05-17 MED ORDER — PREDNISONE 10 MG PO TABS: ORAL_TABLET | ORAL | Status: DC

## 2012-05-17 NOTE — Assessment & Plan Note (Signed)
Asthma exacerbation not improving with standard treatment. Will  Continue symbicort but discontinue Qvar. See instructions. Has been unable to walk from 05/11/2012, will  return to work 05/19/2012, will call for a work excuse if needed Followup in 2 weeks

## 2012-05-17 NOTE — Progress Notes (Signed)
  Subjective:    Patient ID: Lauren Johns, female    DOB: 1970/04/30, 42 y.o.   MRN: [id number]  HPI Symptoms started a week ago: Sore throat, cough, chest congestion, wheezing. Went initially to the urgent care and was  prescribe antibiotics, finally went to the emergency room 05/13/2012 because of persistent cough. Chart reviewed, chest x-ray was negative and she was discharged home with medication: symbicort, Qvar, Z-Pak, albuterol, prednisone 30 mg taper. She is here because she is actually not much better. Has been unable to work.  Past Medical History  Diagnosis Date  . Asthma   . Depression     h/o   . GERD (gastroesophageal reflux disease)   . Insomnia   . Obesity   . PFO (patent foramen ovale)     ??PFO repair:  in the 90s, was seen several times after and released (per patient)  . Arthritis   . Chronic kidney disease   . Elevated blood sugar   . Elevated lipids    Past Surgical History  Procedure Date  . Closed hearty valve repair 1993    [facility] , PFO repair??    Review of Systems Had on and off temperature of 99, occasional chills. Had some sinus congestion but is actually better. No GERD symptoms. No nausea or vomiting except when it spells of severe cough. Some myalgias. Unable to bring up sputum. She has a history of asthma, has been very well, this is a first exacerbation in more than one year.     Objective:   Physical Exam General -- alert, well-developed     HEENT -- TMs normal, throat w/o redness, face symmetric and not tender to palpation, nose not congested  Lungs -- frequent cough noted, no respiratory distress, large airway congestion and end expiratory wheezing noted. No crackles   Heart-- normal rate, regular rhythm, no murmur, and no gallop.    Neurologic-- alert & oriented X3 and strength normal in all extremities. Psych-- Cognition and judgment appear intact. Alert and cooperative with normal attention span and concentration.  not  anxious appearing and not depressed appearing.        Assessment & Plan:  Today , I spent more than 25 min with the patient, >50% of the time counseling about the plan of care and reviewing ER records

## 2012-05-17 NOTE — Patient Instructions (Addendum)
Finish Zithromax as prescribed Prednisone: 5 tablets for 2 days, 4 tablets x 2 days, 3 tablets x 2 days, 2 tablets x 2 days and 1 tablet x 2 days Symbicort 2 puffs twice a day Discontinue Qvar Mucinex DM twice a day --->  for 3 days then as needed Albuterol 4 times a day either inhaler or nebulization --->  for 3 days then as needed If the cough persust, take hydrocodone as needed. Come back in 2 weeks for reassessment, call if not improving soon.

## 2012-05-18 ENCOUNTER — Encounter: Payer: Self-pay | Admitting: Internal Medicine

## 2012-05-23 ENCOUNTER — Telehealth: Payer: Self-pay | Admitting: *Deleted

## 2012-05-23 NOTE — Telephone Encounter (Signed)
Left detailed msg on pt's vmail to check on pt. Advised pt if she's not feeling any better to please call the office.

## 2012-05-31 ENCOUNTER — Ambulatory Visit (INDEPENDENT_AMBULATORY_CARE_PROVIDER_SITE_OTHER): Payer: BC Managed Care – PPO | Admitting: Family Medicine

## 2012-05-31 ENCOUNTER — Encounter: Payer: Self-pay | Admitting: Family Medicine

## 2012-05-31 ENCOUNTER — Telehealth: Payer: Self-pay | Admitting: Internal Medicine

## 2012-05-31 VITALS — BP 130/90 | HR 76 | Temp 98.2°F | Ht 65.25 in | Wt 302.4 lb

## 2012-05-31 MED ORDER — HYDROCODONE-ACETAMINOPHEN 5-325 MG PO TABS: 1.0000 | ORAL_TABLET | Freq: Four times a day (QID) | ORAL | Status: DC | PRN

## 2012-05-31 MED ORDER — PROMETHAZINE-DM 6.25-15 MG/5ML PO SYRP: 5.0000 mL | ORAL_SOLUTION | Freq: Four times a day (QID) | ORAL | Status: DC | PRN

## 2012-05-31 MED ORDER — NAPROXEN 500 MG PO TABS: 500.0000 mg | ORAL_TABLET | Freq: Two times a day (BID) | ORAL | Status: AC

## 2012-05-31 NOTE — Assessment & Plan Note (Signed)
New.  Pain most likely due to persistent cough and recent PNA.  Pain is not pleuritic and not consistent w/ PE.  Continue scheduled NSAIDs and add hydrocodone prn.  Reviewed supportive care and red flags that should prompt return.  Pt expressed understanding and is in agreement w/ plan.

## 2012-05-31 NOTE — Progress Notes (Signed)
  Subjective:    Patient ID: Margaretha Glassing, female    DOB: Nov 10, 1970, 42 y.o.   MRN: [id number]  HPI Pt reports being sick x3 weeks- initially dx'd w/ bronchitis and started on prednisone and cough syrup.  Went to ER, saw PCP- more prednisone, abx.  Went to [facility] last Monday and was given IV abx, CXR showed PNA.  D/c'd w/ cough syrup, prednisone, inhaler (name unknown).  Now having 'excrutiating pain' w/ coughing, sneezing, talking.  Taking 'pain relievers' (Naprosyn) w/out relief.  Pain under L breast and radiating around ribs.  Pt reports ER doc wanted to give percocet but she's allergic.  No longer on prednisone.  Will still get winded w/ talking.  Will wheeze intermittently.   Review of Systems For ROS see HPI     Objective:   Physical Exam  Vitals reviewed. Constitutional: She is oriented to person, place, and time. She appears well-developed and well-nourished. No distress.  HENT:  Head: Normocephalic and atraumatic.  TMs normal bilaterally Mild nasal congestion Throat w/out erythema, edema, or exudate  Eyes: Conjunctivae and EOM are normal. Pupils are equal, round, and reactive to light.  Neck: Normal range of motion. Neck supple.  Cardiovascular: Normal rate, regular rhythm, normal heart sounds and intact distal pulses.   No murmur heard. Pulmonary/Chest: Effort normal and breath sounds normal. No respiratory distress. She has no wheezes. She has no rales. She exhibits tenderness (TTP over both L and R chest walls).  No cough heard during exam  Lymphadenopathy:    She has no cervical adenopathy.  Neurological: She is alert and oriented to person, place, and time.          Assessment & Plan:

## 2012-05-31 NOTE — Patient Instructions (Addendum)
Start the Naproxen twice daily (w/ food) for 7-10 days and then as needed Use the Hydrocodone as needed for severe pain HEAT! Use the promethazine cough syrup as needed Drink plenty of fluids REST! Continue your inhalers as needed Hang in there!

## 2012-05-31 NOTE — Assessment & Plan Note (Signed)
New to provider- pt dx'd at [facility] and has completed abx.  Continue cough syrup, inhalers prn for SOB- no wheezing heard on PE today.  Reviewed that PNA can take some time to recover from and pt needs to be patient.  Reviewed supportive care and red flags that should prompt return.  Pt expressed understanding and is in agreement w/ plan.

## 2012-05-31 NOTE — Telephone Encounter (Signed)
Patient Information:  Caller Name: Lauren Johns  Phone: [phone]  Patient: Lauren Johns  Gender: Female  DOB: 1970/07/23  Age: 42 Years  PCP: Lauren Johns  Pregnant: No  Office Follow Up:  Does the office need to follow up with this patient?: No  Instructions For The Office: N/A  RN Note:  Notes severe chest pain when she coughs.  Continues to have wheezing and mild SOB with exertion.  Per cough protocol, advised appt today; appt scheduled 1445 05/31/12 with Lauren Johns.  krs/can  Symptoms  Reason For Call & Symptoms: bronchitis, still not better; seen in office, and ED x 2.  Finished antibiotic/Levaquin for pneumonia 05/31/12 but has not improved.  Reviewed Health History In EMR: Yes  Reviewed Medications In EMR: Yes  Reviewed Allergies In EMR: Yes  Reviewed Surgeries / Procedures: Yes  Date of Onset of Symptoms: 05/03/2012 OB / GYN:  LMP: Unknown  Guideline(s) Used:  Cough  Disposition Per Guideline:   See Today in Office  Reason For Disposition Reached:   Known COPD or other severe lung disease (i.e., bronchiectasis, cystic fibrosis, lung surgery) and worsening symptoms (i.e., increased sputum purulence or amount, increased breathing difficulty)  Advice Given:  N/A  Appointment Scheduled:  05/31/2012 14:45:00 Appointment Scheduled Provider:  Sheliah Johns.

## 2012-06-09 ENCOUNTER — Encounter: Payer: Self-pay | Admitting: Family Medicine

## 2012-06-09 ENCOUNTER — Ambulatory Visit (INDEPENDENT_AMBULATORY_CARE_PROVIDER_SITE_OTHER): Payer: BC Managed Care – PPO | Admitting: Family Medicine

## 2012-06-09 VITALS — BP 128/78 | HR 58 | Temp 98.2°F | Ht 65.25 in | Wt 305.8 lb

## 2012-06-09 DIAGNOSIS — N644 Mastodynia: Secondary | ICD-10-CM

## 2012-06-09 NOTE — Patient Instructions (Addendum)
This appears to be breast ligament pain Continue the Naproxen twice daily HEAT! We'll notify you of your mammogram but don't stress about this!  We're just being cautious Wear a good supportive bra Call with any questions or concerns Hang in there!

## 2012-06-09 NOTE — Progress Notes (Signed)
  Subjective:    Patient ID: Lauren Johns, female    DOB: 1971-02-02, 42 y.o.   MRN: [id number]  HPI L chest pain- sharp, still located under L breast.  Taking Naproxen and hydrocodone and had good relief previously but then yesterday developed sharp pain again.  + TTP.  No longer having pain w/ breathing.  + pain w/ motion.  Pain located more in breast than chest   Review of Systems For ROS see HPI     Objective:   Physical Exam  Vitals reviewed. Constitutional: She appears well-developed and well-nourished. No distress.  HENT:  Head: Normocephalic and atraumatic.  Cardiovascular: Normal rate, regular rhythm and normal heart sounds.   Pulmonary/Chest: Effort normal and breath sounds normal. No respiratory distress. She has no wheezes. She has no rales.  No TTP over ribs or intercostal muscles.  + TTP over inferior L breast w/out obvious mass  Abdominal: Soft. Bowel sounds are normal. She exhibits no distension. There is no tenderness. There is no rebound.  Musculoskeletal: She exhibits no edema.  Skin: Skin is warm and dry.  Psychiatric: She has a normal mood and affect. Her behavior is normal.          Assessment & Plan:

## 2012-06-11 NOTE — Assessment & Plan Note (Signed)
New.  Suspect this is ligamentous strain from coughing but will get mammo to r/o breast abnormality.  Continue NSAIDs prn.  Reviewed supportive care and red flags that should prompt return.  Pt expressed understanding and is in agreement w/ plan.

## 2012-06-28 ENCOUNTER — Telehealth: Payer: Self-pay | Admitting: Internal Medicine

## 2012-06-28 NOTE — Telephone Encounter (Signed)
Noted  

## 2012-06-28 NOTE — Telephone Encounter (Signed)
Patient Information:  Caller Name: Calleen  Phone: [phone]  Patient: Lauren Johns  Gender: Female  DOB: 25-Jan-1971  Age: 42 Years  PCP: Willow Ora  Pregnant: No  Office Follow Up:  Does the office need to follow up with this patient?: No  Instructions For The Office: N/A  RN Note:  Patient states she can not come in to office today due to her Lauren and refuses ED disposition.  Pt request appt in the AM 06/29/12 with Dr Nathaniel Man. Appt scheduled for 06/29/12 0900 with Dr Nathaniel Man.  Advised pt to call back if pain worsens,  difficulty breathing or go to ED for evaluation.   Symptoms  Reason For Call & Symptoms: Pt states was diagnosed Pneumonia 04/2012 and now having the same pain in the left side with coughing or deep breathe.  Reviewed Health History In EMR: Yes  Reviewed Medications In EMR: Yes  Reviewed Allergies In EMR: Yes  Reviewed Surgeries / Procedures: Yes  Date of Onset of Symptoms: 06/24/2012 OB / GYN:  LMP: 04/30/2012  Guideline(s) Used:  Cough  Disposition Per Guideline:   Go to ED Now  Reason For Disposition Reached:   Chest pain present when not coughing  Advice Given:  N/A  Patient Refused Recommendation:  Patient Refused Appt, Patient Requests Appt At Later Date  Pt refused appt in the office and ED disposition for today and requested appt tomorrow 06/29/12 due to unable to leave her Lauren.

## 2012-06-29 ENCOUNTER — Ambulatory Visit (INDEPENDENT_AMBULATORY_CARE_PROVIDER_SITE_OTHER): Payer: BC Managed Care – PPO | Admitting: Internal Medicine

## 2012-06-29 VITALS — BP 120/82 | HR 68 | Temp 98.1°F | Wt 293.0 lb

## 2012-06-29 DIAGNOSIS — R0989 Other specified symptoms and signs involving the circulatory and respiratory systems: Secondary | ICD-10-CM

## 2012-06-29 LAB — CBC WITH DIFFERENTIAL/PLATELET
Basophils Absolute: 0 10*3/uL (ref 0.0–0.1)
Basophils Relative: 0.5 % (ref 0.0–3.0)
Eosinophils Absolute: 0 10*3/uL (ref 0.0–0.7)
Hemoglobin: 12.8 g/dL (ref 12.0–15.0)
MCHC: 33.2 g/dL (ref 30.0–36.0)
MCV: 92.5 fl (ref 78.0–100.0)
Monocytes Absolute: 0.6 10*3/uL (ref 0.1–1.0)
Neutro Abs: 3.4 10*3/uL (ref 1.4–7.7)
RBC: 4.16 Mil/uL (ref 3.87–5.11)
RDW: 14 % (ref 11.5–14.6)

## 2012-06-29 LAB — BASIC METABOLIC PANEL
CO2: 28 mEq/L (ref 19–32)
Calcium: 9.5 mg/dL (ref 8.4–10.5)
Chloride: 103 mEq/L (ref 96–112)
Creatinine, Ser: 0.8 mg/dL (ref 0.4–1.2)
Glucose, Bld: 100 mg/dL — ABNORMAL HIGH (ref 70–99)
Sodium: 139 mEq/L (ref 135–145)

## 2012-06-29 MED ORDER — BUDESONIDE-FORMOTEROL FUMARATE 160-4.5 MCG/ACT IN AERO: 2.0000 | INHALATION_SPRAY | Freq: Two times a day (BID) | RESPIRATORY_TRACT | Status: DC

## 2012-06-29 MED ORDER — OMEPRAZOLE 40 MG PO CPDR
40.0000 mg | DELAYED_RELEASE_CAPSULE | Freq: Every day | ORAL | Status: DC
Start: 2012-06-29 — End: 2012-07-03

## 2012-06-29 NOTE — Progress Notes (Signed)
  Subjective:    Patient ID: Margaretha Glassing, female    DOB: 1970/10/07, 42 y.o.   MRN: [id number]  HPI Here for evaluation of ongoing respiratory symptoms. She has not been well since the end of January--> see previous visit. She continue to have dyspnea on exertion when she goes up and down the stairs, she continue with wheezing, worse at night particularly when she lays down in bed. Also having on and off chest pain, left-sided, increased by sneezing, cough or deep breathing. Chest pain is not related to food intake. Current taking Symbicort one puff twice a day and using Ventolin every night.  Past Medical History  Diagnosis Date  . Asthma   . Depression     h/o   . GERD (gastroesophageal reflux disease)   . Insomnia   . Obesity   . PFO (patent foramen ovale)     ??PFO repair:  in the 90s, was seen several times after and released (per patient)  . Arthritis   . Chronic kidney disease   . Elevated blood sugar   . Elevated lipids    Past Surgical History  Procedure Laterality Date  . Closed hearty valve repair  1993    [place] , PFO repair??    Review of Systems No GERD symptoms. No postnasal dripping. Denies fever, chills or sputum production. No itchy eyes or nose. No frequent airplane trips. No swelling or pain in the calves. Menstrual periods are irregular.    Objective:   Physical Exam BP 120/82  Pulse 68  Temp(Src) 98.1 F (36.7 C) (Oral)  Wt 293 lb (132.904 kg)  BMI 48.41 kg/m2  SpO2 97%  General -- alert, well-developed, BMI 48 exertion .   Neck --no JVD at 45  Lungs -- normal respiratory effort, no intercostal retractions, no accessory muscle use, and normal breath sounds.   Chest wall--tender to palpation in the distal, left lateral chest Heart-- normal rate, regular rhythm, no murmur, and no gallop.   Abdomen--soft, non-tender, no distention, no masses, no HSM, no guarding, and no rigidity.   Extremities-- no pretibial edema  bilaterally Neurologic-- alert & oriented X3 and strength normal in all extremities. Psych-- Cognition and judgment appear intact. Alert and cooperative with normal attention span and concentration.  not anxious appearing and not depressed appearing.      Assessment & Plan:

## 2012-06-29 NOTE — Patient Instructions (Addendum)
Change Symbicort to  160 mg, 2 puffs  twice a day Omeprazole, one tablet before breakfast. Albuterol as needed

## 2012-06-30 ENCOUNTER — Encounter: Payer: Self-pay | Admitting: Internal Medicine

## 2012-06-30 NOTE — Assessment & Plan Note (Signed)
DOE Having respiratory symptoms since the end of January: DOE, wheezing at bedtime, L sided pleuritic chest pain. Was initially seen at the ER 05/13/2012, was treated for asthma exacerbation, chest x-ray was negative. Seen here 05/17/2012 with the same diagnosis. Went to an outside hospital, [facility], dx with pneumonia, chest x-ray report  not available. EKG today is at baseline. Chart reviewed, had a normal echocardiogram 10/2011. DDX includes:  uncontrolled asthma, occult GERD, deconditioning, obesity, multifactorial At this point since she has persistent symptoms, plan is: CBC BMP Increased Symbicort 82 Symbicort 162 pounds twice a day. Empiric Prilosec. Pulmonary referral

## 2012-07-03 ENCOUNTER — Telehealth: Payer: Self-pay | Admitting: Internal Medicine

## 2012-07-03 ENCOUNTER — Encounter: Payer: Self-pay | Admitting: *Deleted

## 2012-07-03 MED ORDER — OMEPRAZOLE 40 MG PO CPDR: 40.0000 mg | DELAYED_RELEASE_CAPSULE | Freq: Every day | ORAL | Status: DC

## 2012-07-03 NOTE — Telephone Encounter (Signed)
Patient went to her pharmacy for Prilosec 40mg , and they did not have.  Patient states Rx should go to [address]  Will we please resend to pharmacy?

## 2012-07-03 NOTE — Telephone Encounter (Signed)
rx resent  

## 2012-07-25 ENCOUNTER — Institutional Professional Consult (permissible substitution): Payer: BC Managed Care – PPO | Admitting: Pulmonary Disease

## 2012-10-06 ENCOUNTER — Institutional Professional Consult (permissible substitution): Payer: BC Managed Care – PPO | Admitting: Pulmonary Disease

## 2012-10-17 ENCOUNTER — Encounter: Payer: Self-pay | Admitting: Pulmonary Disease

## 2012-11-03 ENCOUNTER — Institutional Professional Consult (permissible substitution): Payer: BC Managed Care – PPO | Admitting: Pulmonary Disease

## 2012-11-24 ENCOUNTER — Inpatient Hospital Stay: Admission: RE | Admit: 2012-11-24 | Payer: BC Managed Care – PPO | Source: Ambulatory Visit

## 2012-11-27 ENCOUNTER — Institutional Professional Consult (permissible substitution): Payer: BC Managed Care – PPO | Admitting: Pulmonary Disease

## 2013-04-03 ENCOUNTER — Ambulatory Visit (INDEPENDENT_AMBULATORY_CARE_PROVIDER_SITE_OTHER): Payer: BC Managed Care – PPO | Admitting: Internal Medicine

## 2013-04-03 ENCOUNTER — Encounter: Payer: Self-pay | Admitting: Internal Medicine

## 2013-04-03 VITALS — BP 126/72 | HR 58 | Temp 98.2°F | Wt 301.0 lb

## 2013-04-03 DIAGNOSIS — R21 Rash and other nonspecific skin eruption: Secondary | ICD-10-CM

## 2013-04-03 DIAGNOSIS — E119 Type 2 diabetes mellitus without complications: Secondary | ICD-10-CM | POA: Insufficient documentation

## 2013-04-03 DIAGNOSIS — R7309 Other abnormal glucose: Secondary | ICD-10-CM

## 2013-04-03 DIAGNOSIS — R739 Hyperglycemia, unspecified: Secondary | ICD-10-CM

## 2013-04-03 HISTORY — DX: Type 2 diabetes mellitus without complications: E11.9

## 2013-04-03 MED ORDER — HYDROCORTISONE 2.5 % EX CREA: TOPICAL_CREAM | Freq: Two times a day (BID) | CUTANEOUS | Status: DC

## 2013-04-03 NOTE — Patient Instructions (Signed)
Get your blood work before you leave  Next visit for   follow up  regards your blood sugar, fasting, in 3 months  Please make an appointment

## 2013-04-03 NOTE — Progress Notes (Signed)
   Subjective:    Patient ID: Lauren Johns, female    DOB: [birth year], 42 y.o.   MRN: [id number]  HPI Acute visit  Few days ago developed an itchy rash around the neck like . Denies exposure to any new things including no new detergent or necklaces. Also had her physical w/ gynecology, was told her CBG was borderline elevated. The patient is concerned  Past Medical History  Diagnosis Date  . Asthma   . Depression     h/o   . GERD (gastroesophageal reflux disease)   . Insomnia   . Obesity   . PFO (patent foramen ovale)     ??PFO repair:  in the 90s, was seen several times after and released (per patient)  . Arthritis   . Chronic kidney disease   . Elevated blood sugar   . Elevated lipids    Past Surgical History  Procedure Laterality Date  . Closed hearty valve repair  1993    [place] , PFO repair??     Review of Systems Diet and exercise: Not healthy, has gained weight. Denies any visual disturbances, increased urination. Reports that she is always thirsty but that's nothing new.     Objective:   Physical Exam BP 126/72  Pulse 58  Temp(Src) 98.2 F (36.8 C)  Wt 301 lb (136.533 kg)  SpO2 97%  General -- alert, well-developed, NAD.  Neck --  Mild hyperpigmentation without scaliness  anteriorly and on the left posterior area, the rest of the rash has resolved according to the patient Neurologic--  alert & oriented X3.   Psych-- Cognition and judgment appear intact. Cooperative with normal attention span and concentration. No anxious appearing , no depressed appearing.      Assessment & Plan:  Extremely pruritic rash, probably an allergic reaction to..? Plan: Hydrocortisone 2.5%, avoid necklaces x few days, call if no better

## 2013-04-03 NOTE — Assessment & Plan Note (Signed)
Patient reports hyperglycemia a gynecological exam, will check an A1c; what is a A1c discussed. She has an unhealthy lifestyle but has plans to change in the next few days.

## 2013-04-03 NOTE — Progress Notes (Signed)
Pre visit review using our clinic review tool, if applicable. No additional management support is needed unless otherwise documented below in the visit note. 

## 2013-04-06 ENCOUNTER — Encounter: Payer: Self-pay | Admitting: *Deleted

## 2013-05-21 DIAGNOSIS — G43909 Migraine, unspecified, not intractable, without status migrainosus: Secondary | ICD-10-CM | POA: Insufficient documentation

## 2013-05-21 DIAGNOSIS — J309 Allergic rhinitis, unspecified: Secondary | ICD-10-CM | POA: Insufficient documentation

## 2013-05-21 HISTORY — DX: Allergic rhinitis, unspecified: J30.9

## 2013-06-19 ENCOUNTER — Encounter: Payer: Self-pay | Admitting: Internal Medicine

## 2013-07-02 ENCOUNTER — Ambulatory Visit: Payer: BC Managed Care – PPO | Admitting: Internal Medicine

## 2013-07-05 ENCOUNTER — Telehealth: Payer: Self-pay | Admitting: Internal Medicine

## 2013-07-05 NOTE — Telephone Encounter (Signed)
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 Patient wants to know if there is anything that she should do to avoid getting chicken pox?  She states that she had chicken pox as a child.

## 2013-07-05 NOTE — Telephone Encounter (Signed)
Spoke with patient and advised to avoid direct contact with anyone with chicken pox and to wipe down surface areas with bleach wipes. Patinet interested in receiving varicella vaccine. Appt scheduled for tomorrow afternoon.

## 2013-07-06 ENCOUNTER — Ambulatory Visit (INDEPENDENT_AMBULATORY_CARE_PROVIDER_SITE_OTHER): Payer: BC Managed Care – PPO

## 2013-07-06 DIAGNOSIS — Z579 Occupational exposure to unspecified risk factor: Secondary | ICD-10-CM

## 2013-07-06 DIAGNOSIS — Z569 Unspecified problems related to employment: Secondary | ICD-10-CM

## 2013-07-07 LAB — VARICELLA ZOSTER ANTIBODY, IGG: VARICELLA IGG: 3372 {index} — AB (ref <135.00)

## 2013-07-09 ENCOUNTER — Encounter: Payer: Self-pay | Admitting: *Deleted

## 2013-10-25 ENCOUNTER — Encounter (HOSPITAL_COMMUNITY): Payer: Self-pay | Admitting: Emergency Medicine

## 2013-10-25 ENCOUNTER — Emergency Department (HOSPITAL_COMMUNITY): Payer: BC Managed Care – PPO

## 2013-10-25 ENCOUNTER — Emergency Department (HOSPITAL_COMMUNITY)
Admission: EM | Admit: 2013-10-25 | Discharge: 2013-10-25 | Disposition: A | Discharge status: Home / Self Care | Payer: BC Managed Care – PPO | Attending: Emergency Medicine | Admitting: Emergency Medicine

## 2013-10-25 DIAGNOSIS — Z8719 Personal history of other diseases of the digestive system: Secondary | ICD-10-CM | POA: Insufficient documentation

## 2013-10-25 DIAGNOSIS — S6990XA Unspecified injury of unspecified wrist, hand and finger(s), initial encounter: Secondary | ICD-10-CM | POA: Diagnosis present

## 2013-10-25 DIAGNOSIS — IMO0002 Reserved for concepts with insufficient information to code with codable children: Secondary | ICD-10-CM | POA: Insufficient documentation

## 2013-10-25 DIAGNOSIS — Z79899 Other long term (current) drug therapy: Secondary | ICD-10-CM | POA: Diagnosis not present

## 2013-10-25 DIAGNOSIS — M25559 Pain in unspecified hip: Secondary | ICD-10-CM | POA: Diagnosis not present

## 2013-10-25 DIAGNOSIS — Z8659 Personal history of other mental and behavioral disorders: Secondary | ICD-10-CM | POA: Diagnosis not present

## 2013-10-25 DIAGNOSIS — E669 Obesity, unspecified: Secondary | ICD-10-CM | POA: Insufficient documentation

## 2013-10-25 DIAGNOSIS — Z3202 Encounter for pregnancy test, result negative: Secondary | ICD-10-CM | POA: Diagnosis not present

## 2013-10-25 DIAGNOSIS — Y92410 Unspecified street and highway as the place of occurrence of the external cause: Secondary | ICD-10-CM | POA: Diagnosis not present

## 2013-10-25 DIAGNOSIS — M129 Arthropathy, unspecified: Secondary | ICD-10-CM | POA: Insufficient documentation

## 2013-10-25 DIAGNOSIS — T148XXA Other injury of unspecified body region, initial encounter: Secondary | ICD-10-CM

## 2013-10-25 DIAGNOSIS — Z880 Allergy status to penicillin: Secondary | ICD-10-CM | POA: Insufficient documentation

## 2013-10-25 DIAGNOSIS — S59909A Unspecified injury of unspecified elbow, initial encounter: Secondary | ICD-10-CM | POA: Diagnosis present

## 2013-10-25 DIAGNOSIS — T07XXXA Unspecified multiple injuries, initial encounter: Secondary | ICD-10-CM | POA: Insufficient documentation

## 2013-10-25 DIAGNOSIS — J45909 Unspecified asthma, uncomplicated: Secondary | ICD-10-CM | POA: Insufficient documentation

## 2013-10-25 DIAGNOSIS — S51809A Unspecified open wound of unspecified forearm, initial encounter: Secondary | ICD-10-CM | POA: Diagnosis not present

## 2013-10-25 DIAGNOSIS — Y9389 Activity, other specified: Secondary | ICD-10-CM | POA: Insufficient documentation

## 2013-10-25 DIAGNOSIS — S8990XA Unspecified injury of unspecified lower leg, initial encounter: Secondary | ICD-10-CM | POA: Insufficient documentation

## 2013-10-25 DIAGNOSIS — Z9889 Other specified postprocedural states: Secondary | ICD-10-CM | POA: Diagnosis not present

## 2013-10-25 DIAGNOSIS — J9819 Other pulmonary collapse: Secondary | ICD-10-CM | POA: Diagnosis not present

## 2013-10-25 DIAGNOSIS — N189 Chronic kidney disease, unspecified: Secondary | ICD-10-CM | POA: Insufficient documentation

## 2013-10-25 DIAGNOSIS — S99919A Unspecified injury of unspecified ankle, initial encounter: Secondary | ICD-10-CM | POA: Diagnosis not present

## 2013-10-25 DIAGNOSIS — S99929A Unspecified injury of unspecified foot, initial encounter: Secondary | ICD-10-CM

## 2013-10-25 LAB — HCG, SERUM, QUALITATIVE: PREG SERUM: NEGATIVE

## 2013-10-25 LAB — I-STAT CHEM 8, ED
BUN: 10 mg/dL (ref 6–23)
CHLORIDE: 104 meq/L (ref 96–112)
Calcium, Ion: 1.14 mmol/L (ref 1.12–1.23)
Creatinine, Ser: 0.8 mg/dL (ref 0.50–1.10)
Glucose, Bld: 102 mg/dL — ABNORMAL HIGH (ref 70–99)
HEMATOCRIT: 38 % (ref 36.0–46.0)
Hemoglobin: 12.9 g/dL (ref 12.0–15.0)
POTASSIUM: 4.2 meq/L (ref 3.7–5.3)
SODIUM: 140 meq/L (ref 137–147)
TCO2: 24 mmol/L (ref 0–100)

## 2013-10-25 MED ORDER — MORPHINE SULFATE 4 MG/ML IJ SOLN
4.0000 mg | Freq: Once | INTRAMUSCULAR | Status: AC
  Administered 2013-10-25: 4 mg via INTRAVENOUS
  Filled 2013-10-25: qty 1

## 2013-10-25 MED ORDER — ONDANSETRON HCL 4 MG/2ML IJ SOLN
4.0000 mg | Freq: Once | INTRAMUSCULAR | Status: AC
  Administered 2013-10-25: 4 mg via INTRAVENOUS
  Filled 2013-10-25: qty 2

## 2013-10-25 MED ORDER — SODIUM CHLORIDE 0.9 % IV BOLUS (SEPSIS)
125.0000 mL | Freq: Once | INTRAVENOUS | Status: AC
  Administered 2013-10-25: 125 mL via INTRAVENOUS

## 2013-10-25 MED ORDER — HYDROCODONE-ACETAMINOPHEN 5-325 MG PO TABS: 1.0000 | ORAL_TABLET | ORAL | Status: DC | PRN

## 2013-10-25 MED ORDER — IOHEXOL 300 MG/ML  SOLN
100.0000 mL | Freq: Once | INTRAMUSCULAR | Status: AC | PRN
  Administered 2013-10-25: 100 mL via INTRAVENOUS

## 2013-10-25 MED ORDER — KETOROLAC TROMETHAMINE 30 MG/ML IJ SOLN
30.0000 mg | Freq: Once | INTRAMUSCULAR | Status: AC
  Administered 2013-10-25: 30 mg via INTRAVENOUS
  Filled 2013-10-25: qty 1

## 2013-10-25 NOTE — ED Notes (Signed)
MD Knapp at bedside 
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
## 2013-10-25 NOTE — ED Notes (Signed)
Pt provided ginger ale per [place]

## 2013-10-25 NOTE — ED Notes (Signed)
Pt returned from XR reporting pain a 10/10

## 2013-10-25 NOTE — ED Notes (Signed)
Anti-bacterial cream placed on arm. Wrapped in [employer]
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
## 2013-10-25 NOTE — ED Notes (Signed)
Phlebotomy at bedside attempting blood draw

## 2013-10-25 NOTE — ED Notes (Signed)
Pt continues to be monitored by blood pressure and pulse ox. 
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
## 2013-10-25 NOTE — ED Notes (Signed)
Pt ambulated to the bathroom. Did well. Pt continues to c/o right hip pain. Became diaphoretic and nauseated in the bathroom. Wheeled back to room. Placed back in stretcher.

## 2013-10-29 ENCOUNTER — Ambulatory Visit (INDEPENDENT_AMBULATORY_CARE_PROVIDER_SITE_OTHER): Payer: BC Managed Care – PPO | Admitting: Internal Medicine

## 2013-10-29 ENCOUNTER — Ambulatory Visit (HOSPITAL_BASED_OUTPATIENT_CLINIC_OR_DEPARTMENT_OTHER)
Admission: RE | Admit: 2013-10-29 | Discharge: 2013-10-29 | Disposition: A | Discharge status: Home / Self Care | Payer: BC Managed Care – PPO | Source: Ambulatory Visit | Attending: Internal Medicine | Admitting: Internal Medicine

## 2013-10-29 ENCOUNTER — Encounter (HOSPITAL_BASED_OUTPATIENT_CLINIC_OR_DEPARTMENT_OTHER): Payer: Self-pay

## 2013-10-29 ENCOUNTER — Encounter: Payer: Self-pay | Admitting: Internal Medicine

## 2013-10-29 DIAGNOSIS — M25559 Pain in unspecified hip: Secondary | ICD-10-CM | POA: Insufficient documentation

## 2013-10-29 DIAGNOSIS — R9389 Abnormal findings on diagnostic imaging of other specified body structures: Secondary | ICD-10-CM

## 2013-10-29 MED ORDER — CELECOXIB 200 MG PO CAPS: 200.0000 mg | ORAL_CAPSULE | Freq: Every day | ORAL | Status: DC | PRN
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
## 2013-10-29 NOTE — Patient Instructions (Addendum)
Take Celebrex one tablet daily as needed for pain. Always take it with food because may cause gastritis and ulcers. If you notice nausea, stomach pain, change in the color of stools --->  Stop the medicine and let us know  If the pain continue, okay to take hydrocodone, will cause drowsiness  MiraLax OTC : 17 g with fluids daily to prevent constipation  See your  gynecologist within a month  Come back in  3 weeks for a check up   Get the XR at [facility] and [address] (10 minutes form here); they are open 24/7 [address]  [place], [place] [address] [phone]

## 2013-10-29 NOTE — Progress Notes (Signed)
Pre visit review using our clinic review tool, if applicable. No additional management support is needed unless otherwise documented below in the visit note. 

## 2013-10-30 ENCOUNTER — Telehealth: Payer: Self-pay | Admitting: *Deleted

## 2013-10-30 MED ORDER — HYDROCODONE-ACETAMINOPHEN 5-325 MG PO TABS: 1.0000 | ORAL_TABLET | Freq: Three times a day (TID) | ORAL | Status: DC | PRN

## 2013-10-30 MED ORDER — MELOXICAM 15 MG PO TABS: 15.0000 mg | ORAL_TABLET | Freq: Every day | ORAL | Status: DC

## 2013-10-30 NOTE — Telephone Encounter (Signed)
Change to mobic , rx sent, rec to take 1 a day with food  rx for hydrocodone printed

## 2013-10-30 NOTE — Telephone Encounter (Signed)
Pt notified of xray results . Pt states that her insurance wont cover the celebrex. Pt also requesting refill on her hydrocodone 5-325mg  last refilled 10/25/13 # 30 / 0 rf

## 2013-10-31 NOTE — Telephone Encounter (Signed)
Spoke with pt . Pt states will have sister pick up rx .

## 2013-11-02 ENCOUNTER — Telehealth: Payer: Self-pay | Admitting: Internal Medicine
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
## 2013-11-02 NOTE — Telephone Encounter (Signed)
Patient states that she was evaluated by "bone specialist" this morning and they would like to refer her to neuro. Patient a little resistant to go. Advised that it would be best to be sure that there are not underlying issues that are not evident. Patient agrees with plan and will follow up with neuro.

## 2013-11-06 ENCOUNTER — Telehealth: Payer: Self-pay | Admitting: *Deleted

## 2013-11-06 MED ORDER — CELECOXIB 200 MG PO CAPS: 200.0000 mg | ORAL_CAPSULE | Freq: Two times a day (BID) | ORAL | Status: DC

## 2013-11-06 NOTE — Telephone Encounter (Signed)
P.A approved for celebrex 200 mg good till 10/16/13-11/06/14 . Pt notified. Pt states that she has been starting to develop rash from either the mobic or medication that was given by her derm MD . Advised pt to stop mobic and go back to celebrex. rx sent.

## 2013-11-08 ENCOUNTER — Telehealth: Payer: Self-pay | Admitting: Internal Medicine

## 2013-11-08 MED ORDER — DIAZEPAM 5 MG PO TABS: 5.0000 mg | ORAL_TABLET | Freq: Two times a day (BID) | ORAL | Status: DC | PRN

## 2013-11-08 NOTE — Telephone Encounter (Signed)
Done . Pt notified.  

## 2013-11-08 NOTE — Telephone Encounter (Signed)
Will give patient an Rx for Valium 5 mg -- take 1/2 tablet up to twice a day for anxiety.  This will help until she can schedule an appointment for further evaluation and treatment.   Please see which pharmacy the patient would like the Rx sent to.  Also verify LMP as Valium should not be taken if concern for pregnancy.

## 2013-11-08 NOTE — Telephone Encounter (Signed)
Pt states [facility] [address] . Pt states that she's currently on her cycle .

## 2013-11-08 NOTE — Telephone Encounter (Signed)
Please fax prescription to pharmacy listed below.

## 2013-11-08 NOTE — Telephone Encounter (Signed)
Rx request faxed to pharmacy/[facility]  
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
## 2013-11-14 ENCOUNTER — Telehealth: Payer: Self-pay

## 2013-11-14 ENCOUNTER — Telehealth: Payer: Self-pay | Admitting: *Deleted

## 2013-11-14 NOTE — Telephone Encounter (Signed)
Message copied by Reino Bellis on Wed Nov 14, 2013 10:37 AM ------      Message from: Kathlene November E      Created: Wed Nov 14, 2013 10:34 AM      Regarding: call pt        Advise patient, I talked with the radilogist  about the CT of the neck, it showed incidental finding in the brain, we need to do a MRI of her brain.      Order is in. Expect a phone call  ,I'll  let her know the results as soon as they are available ------

## 2013-11-14 NOTE — Telephone Encounter (Signed)
Sent message to [facility] to cancel

## 2013-11-14 NOTE — Telephone Encounter (Signed)
Received Approval letter from [place] via fax for Celecoxib for effective (10/16/2013 through 11/06/2014).   Pt aware.  Pt stated that she would not need it bacause Dr. Larose Kells switched her to Hydrocodone 5-325mg  to take 1 tablet by mouth tid as needed.  She stated it was switched because the Celecoxib costed her $40.00 with insurance.   Informed the pt I will place it in her chart in case she decides to go back on the Celecoxib.  Called the pharmacy to inform them of the approval, and person in the pharmacy stated that she will not be able to place it in the computer because the Celecoxib is pending.//AB/CMA

## 2013-11-14 NOTE — Telephone Encounter (Signed)
Patient states that neuro (Dr Lacinda Axon?) discovered the same finding and they are in the process of getting the approval to proceed with MRI.

## 2013-11-14 NOTE — Telephone Encounter (Signed)
Ok, please cancel the MRI I ordered, thanks

## 2013-11-14 NOTE — Telephone Encounter (Signed)
Mail box full. Unable to leave voice mail.

## 2013-11-15 NOTE — Telephone Encounter (Signed)
Spoke with [facility] and MRI is cancelled.

## 2013-11-19 ENCOUNTER — Ambulatory Visit: Payer: BC Managed Care – PPO | Admitting: Internal Medicine

## 2014-02-14 ENCOUNTER — Other Ambulatory Visit: Payer: Self-pay | Admitting: Neurosurgery

## 2014-02-14 DIAGNOSIS — I671 Cerebral aneurysm, nonruptured: Secondary | ICD-10-CM

## 2014-02-14 DIAGNOSIS — S139XXA Sprain of joints and ligaments of unspecified parts of neck, initial encounter: Secondary | ICD-10-CM

## 2014-02-22 LAB — HEMOGLOBIN A1C: Hgb A1c MFr Bld: 6.6 % — AB (ref 4.0–6.0)

## 2014-02-25 ENCOUNTER — Ambulatory Visit
Admission: RE | Admit: 2014-02-25 | Discharge: 2014-02-25 | Disposition: A | Discharge status: Home / Self Care | Payer: BC Managed Care – PPO | Source: Ambulatory Visit | Attending: Neurosurgery | Admitting: Neurosurgery

## 2014-02-25 DIAGNOSIS — I671 Cerebral aneurysm, nonruptured: Secondary | ICD-10-CM

## 2014-02-25 DIAGNOSIS — S139XXA Sprain of joints and ligaments of unspecified parts of neck, initial encounter: Secondary | ICD-10-CM

## 2014-02-25 MED ORDER — GADOBENATE DIMEGLUMINE 529 MG/ML IV SOLN
20.0000 mL | Freq: Once | INTRAVENOUS | Status: AC | PRN
  Administered 2014-02-25: 20 mL via INTRAVENOUS

## 2014-03-19 ENCOUNTER — Telehealth: Payer: Self-pay

## 2014-03-19 NOTE — Telephone Encounter (Signed)
Received lab results dated (02/22/2014) from [facility], Hgb A1c was 6.6. Pt instructed to schedule F/U appt with PCP. Pt has F/U appt for March 20, 2014 at 1:15.

## 2014-03-20 ENCOUNTER — Encounter: Payer: Self-pay | Admitting: Internal Medicine

## 2014-03-20 ENCOUNTER — Ambulatory Visit (INDEPENDENT_AMBULATORY_CARE_PROVIDER_SITE_OTHER): Payer: BC Managed Care – PPO | Admitting: Internal Medicine

## 2014-03-20 VITALS — BP 128/74 | HR 76 | Temp 98.5°F | Wt 300.4 lb

## 2014-03-20 DIAGNOSIS — J4520 Mild intermittent asthma, uncomplicated: Secondary | ICD-10-CM

## 2014-03-20 DIAGNOSIS — S40852D Superficial foreign body of left upper arm, subsequent encounter: Secondary | ICD-10-CM

## 2014-03-20 DIAGNOSIS — E785 Hyperlipidemia, unspecified: Secondary | ICD-10-CM

## 2014-03-20 DIAGNOSIS — R9089 Other abnormal findings on diagnostic imaging of central nervous system: Secondary | ICD-10-CM | POA: Insufficient documentation

## 2014-03-20 DIAGNOSIS — R930 Abnormal findings on diagnostic imaging of skull and head, not elsewhere classified: Secondary | ICD-10-CM

## 2014-03-20 DIAGNOSIS — E119 Type 2 diabetes mellitus without complications: Secondary | ICD-10-CM

## 2014-03-20 NOTE — Patient Instructions (Signed)
Please come back to the office in 3 months for a routine check up   Come back fasting

## 2014-03-20 NOTE — Progress Notes (Signed)
Pre visit review using our clinic review tool, if applicable. No additional management support is needed unless otherwise documented below in the visit note. 

## 2014-03-20 NOTE — Assessment & Plan Note (Signed)
Abdominal brain MRI, Now follow-up by Dr. Trenton Gammon, neurosurgery. She has pituitary microadenomas

## 2014-03-20 NOTE — Assessment & Plan Note (Signed)
Diabetes A1c 6.6 recently at gynecology. Explained the patient she does have diabetes. Counseled extensively about diet and exercise, myfitnesspal? Referred to a nutritionist? Patient declined, she knows what to do. "I just need to do it".
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
## 2014-03-21 ENCOUNTER — Telehealth: Payer: Self-pay | Admitting: *Deleted

## 2014-03-21 NOTE — Telephone Encounter (Signed)
Received medical record request via fax from [place]. Forwarded to [employer]. JG//CMA

## 2014-03-29 ENCOUNTER — Other Ambulatory Visit: Payer: Self-pay

## 2014-03-29 DIAGNOSIS — S40859A Superficial foreign body of unspecified upper arm, initial encounter: Secondary | ICD-10-CM

## 2014-06-28 ENCOUNTER — Encounter: Payer: BC Managed Care – PPO | Admitting: Internal Medicine

## 2014-10-01 ENCOUNTER — Telehealth: Payer: Self-pay | Admitting: General Practice

## 2014-10-01 NOTE — Telephone Encounter (Signed)
Called pt and LMOVM to schedule a 3 month follow up with Dr. Larose Kells. Pt is overdue for this appt.

## 2014-10-10 ENCOUNTER — Encounter: Payer: Self-pay | Admitting: Internal Medicine

## 2014-10-10 ENCOUNTER — Ambulatory Visit (INDEPENDENT_AMBULATORY_CARE_PROVIDER_SITE_OTHER): Payer: BC Managed Care – PPO | Admitting: Internal Medicine

## 2014-10-10 VITALS — BP 122/68 | HR 58 | Temp 98.2°F | Ht 65.0 in | Wt 302.0 lb

## 2014-10-10 DIAGNOSIS — J3089 Other allergic rhinitis: Secondary | ICD-10-CM | POA: Diagnosis not present

## 2014-10-10 DIAGNOSIS — E119 Type 2 diabetes mellitus without complications: Secondary | ICD-10-CM

## 2014-10-10 DIAGNOSIS — J4520 Mild intermittent asthma, uncomplicated: Secondary | ICD-10-CM | POA: Diagnosis not present

## 2014-10-10 DIAGNOSIS — N926 Irregular menstruation, unspecified: Secondary | ICD-10-CM | POA: Diagnosis not present

## 2014-10-10 DIAGNOSIS — E785 Hyperlipidemia, unspecified: Secondary | ICD-10-CM

## 2014-10-10 LAB — BASIC METABOLIC PANEL
BUN: 13 mg/dL (ref 6–23)
CALCIUM: 9.6 mg/dL (ref 8.4–10.5)
CHLORIDE: 104 meq/L (ref 96–112)
CO2: 27 meq/L (ref 19–32)
Creatinine, Ser: 0.84 mg/dL (ref 0.40–1.20)
GFR: 95.59 mL/min (ref >60.00)
Glucose, Bld: 89 mg/dL (ref 70–99)
POTASSIUM: 3.9 meq/L (ref 3.5–5.1)
SODIUM: 141 meq/L (ref 135–145)

## 2014-10-10 LAB — LIPID PANEL
Cholesterol: 221 mg/dL — ABNORMAL HIGH (ref 0–200)
HDL: 44.9 mg/dL (ref >39.00)
LDL Cholesterol: 157 mg/dL — ABNORMAL HIGH (ref 0–99)
NonHDL: 176.1
TRIGLYCERIDES: 95 mg/dL (ref 0.0–149.0)
Total CHOL/HDL Ratio: 5
VLDL: 19 mg/dL (ref 0.0–40.0)

## 2014-10-10 LAB — TSH: TSH: 1.57 u[IU]/mL (ref 0.35–4.50)

## 2014-10-10 LAB — AST: AST: 23 U/L (ref 0–37)

## 2014-10-10 LAB — ALT: ALT: 22 U/L (ref 0–35)

## 2014-10-10 LAB — POCT URINE PREGNANCY: PREG TEST UR: NEGATIVE

## 2014-10-10 LAB — HEMOGLOBIN A1C: HEMOGLOBIN A1C: 6.1 % (ref 4.6–6.5)

## 2014-10-10 MED ORDER — ALBUTEROL SULFATE HFA 108 (90 BASE) MCG/ACT IN AERS: 1.0000 | INHALATION_SPRAY | Freq: Four times a day (QID) | RESPIRATORY_TRACT | Status: DC | PRN

## 2014-10-10 MED ORDER — BUDESONIDE-FORMOTEROL FUMARATE 160-4.5 MCG/ACT IN AERO: 2.0000 | INHALATION_SPRAY | Freq: Two times a day (BID) | RESPIRATORY_TRACT | Status: DC

## 2014-10-10 NOTE — Progress Notes (Signed)
Subjective:    Patient ID: Lauren Johns, female    DOB: [birth year], 44 y.o.   MRN: [id number]  DOS:  10/10/2014 Type of visit - description : Routine office visit Interval history: Diabetes, not much improvement on diet and exercise Asthma, not taking any medication, essentially asymptomatic except for the last few days when she noted some wheezing at night.    Review of Systems  No fever chills No sore throat. She admits to year around runny nose. Missed her period 3 weeks ago, history of infertility   Past Medical History  Diagnosis Date  . Asthma   . Depression     h/o   . GERD (gastroesophageal reflux disease)   . Insomnia   . Obesity   . PFO (patent foramen ovale)     ??PFO repair:  in the 90s, was seen several times after and released (per patient)  . Arthritis   . Chronic kidney disease   . Elevated lipids   . Abnormal brain MRI   . Allergic rhinitis 05/21/2013  . Diabetes 04/03/2013  . Infertility, female     h/o    Past Surgical History  Procedure Laterality Date  . Closed hearty valve repair  1993    [place] , PFO repair??    History   Social History  . Marital Status: Married    Spouse Name: N/A  . Number of Children: 0  . Years of Education: N/A   Occupational History  . school teacher     Social History Main Topics  . Smoking status: Never Smoker   . Smokeless tobacco: Never Used  . Alcohol Use: Yes     Comment: socially   . Drug Use: No  . Sexual Activity: Not on file   Other Topics Concern  . Not on file   Social History Narrative   mother is Elodia Florence, one of my patients    Lives w/ husband        Medication List       This list is accurate as of: 10/10/14  2:19 PM.  Always use your most recent med list.               albuterol 108 (90 BASE) MCG/ACT inhaler  Commonly known as:  VENTOLIN HFA  Inhale 1 puff into the lungs every 6 (six) hours as needed for wheezing or shortness of breath.     budesonide-formoterol 160-4.5 MCG/ACT inhaler  Commonly known as:  SYMBICORT  Inhale 2 puffs into the lungs 2 (two) times daily.     celecoxib 200 MG capsule  Commonly known as:  CELEBREX  Take 1 capsule (200 mg total) by mouth 2 (two) times daily.           Objective:   Physical Exam BP 122/68 mmHg  Pulse 58  Temp(Src) 98.2 F (36.8 C) (Oral)  Ht 5\' 5"  (1.651 m)  Wt 302 lb (136.986 kg)  BMI 50.26 kg/m2  SpO2 98%  LMP     General:   Well developed, well nourished . NAD.  HEENT:  Normocephalic . Face symmetric, atraumatic Lungs:  CTA B Normal respiratory effort, no intercostal retractions, no accessory muscle use. Heart: RRR,  no murmur.  No pretibial edema bilaterally  Skin: Not pale. Not jaundice Neurologic:  alert & oriented X3.  Speech normal, gait appropriate for age and unassisted Psych--  Cognition and judgment appear intact.  Cooperative with normal attention span and concentration.  Behavior appropriate. No  anxious or depressed appearing.       Assessment & Plan:   Amenorrhea, UPT negative  Today , I spent more than  25  min with the patient: >50% of the time counseling regards diet and exercise

## 2014-10-10 NOTE — Assessment & Plan Note (Addendum)
Well-controlled except for the last few days when she noted some wheezing. Exam today is benign. Concept of  daily vs rescue inhaler use discussed. Restart Symbicort for a few weeks.

## 2014-10-10 NOTE — Patient Instructions (Signed)
Get your blood work before you leave    

## 2014-10-10 NOTE — Assessment & Plan Note (Signed)
We again had a long conversation about diet and exercise, the patient states she knows what to do, has enough information to change her lifestyle, she simply has to do it. We also discussed the role of bariatric surgery. Plan:  CMP, A1c, micro Return to the office since 4 months

## 2014-10-10 NOTE — Assessment & Plan Note (Addendum)
Recommend daily use of Flonase to control rhinitis which will help her asthma

## 2014-10-10 NOTE — Assessment & Plan Note (Signed)
Diet and exercise discussed, check FLP

## 2014-10-10 NOTE — Progress Notes (Signed)
Pre visit review using our clinic review tool, if applicable. No additional management support is needed unless otherwise documented below in the visit note. 

## 2014-11-04 LAB — HM MAMMOGRAPHY

## 2014-11-04 LAB — TSH: TSH: 1.74 u[IU]/mL (ref <=5.90)

## 2014-12-25 ENCOUNTER — Ambulatory Visit (INDEPENDENT_AMBULATORY_CARE_PROVIDER_SITE_OTHER): Payer: BC Managed Care – PPO | Admitting: Internal Medicine

## 2014-12-25 ENCOUNTER — Encounter: Payer: Self-pay | Admitting: Internal Medicine

## 2014-12-25 VITALS — BP 122/74 | HR 63 | Temp 98.1°F | Ht 65.0 in | Wt 306.0 lb

## 2014-12-25 DIAGNOSIS — T7840XA Allergy, unspecified, initial encounter: Secondary | ICD-10-CM | POA: Diagnosis not present

## 2014-12-25 DIAGNOSIS — Z09 Encounter for follow-up examination after completed treatment for conditions other than malignant neoplasm: Secondary | ICD-10-CM | POA: Insufficient documentation

## 2014-12-25 MED ORDER — PREDNISONE 10 MG PO TABS: ORAL_TABLET | ORAL | Status: DC

## 2014-12-25 NOTE — Progress Notes (Signed)
Subjective:    Patient ID: Lauren Johns, female    DOB: 25-Dec-1970, 44 y.o.   MRN: [id number]  DOS:  12/25/2014 Type of visit - description : Acute, reaction to metformin? Interval history: Her gynecologist was trying to improve her blood sugars to see if that impact her infertility. Prescribed metformin in July, she took for 2 weeks and increase her appetite so she discontinued it. Last week she was prescribed metformin again, this time to use the ER 750 mg twice a day, 2 or 3 days later she developed a rash in her arms and generalized, moderate to severe itching. Last metformin dose 3 days ago, she continue with symptoms. She has not taken any other things to account for an allergic reaction.    Review of Systems  Denies any lip or tongue swelling. No shortness of breath  Past Medical History  Diagnosis Date  . Asthma   . Depression     h/o   . GERD (gastroesophageal reflux disease)   . Insomnia   . Obesity   . PFO (patent foramen ovale)     ??PFO repair:  in the 90s, was seen several times after and released (per patient)  . Arthritis   . Chronic kidney disease   . Elevated lipids   . Abnormal brain MRI   . Allergic rhinitis 05/21/2013  . Diabetes 04/03/2013  . Infertility, female     h/o    Past Surgical History  Procedure Laterality Date  . Closed hearty valve repair  1993    [place] , PFO repair??    Social History   Social History  . Marital Status: Married    Spouse Name: N/A  . Number of Children: 0  . Years of Education: N/A   Occupational History  . school teacher     Social History Main Topics  . Smoking status: Never Smoker   . Smokeless tobacco: Never Used  . Alcohol Use: Yes     Comment: socially   . Drug Use: No  . Sexual Activity: Not on file   Other Topics Concern  . Not on file   Social History Narrative   mother is Elodia Florence, one of my patients    Lives w/ husband        Medication List       This list is accurate  as of: 12/25/14  6:49 PM.  Always use your most recent med list.               albuterol 108 (90 BASE) MCG/ACT inhaler  Commonly known as:  VENTOLIN HFA  Inhale 1 puff into the lungs every 6 (six) hours as needed for wheezing or shortness of breath.     budesonide-formoterol 160-4.5 MCG/ACT inhaler  Commonly known as:  SYMBICORT  Inhale 2 puffs into the lungs 2 (two) times daily.     celecoxib 200 MG capsule  Commonly known as:  CELEBREX  Take 1 capsule (200 mg total) by mouth 2 (two) times daily.     predniSONE 10 MG tablet  Commonly known as:  DELTASONE  4 tablets x 2 days, 3 tabs x 2 days, 2 tabs x 2 days, 1 tab x 2 days           Objective:   Physical Exam BP 122/74 mmHg  Pulse 63  Temp(Src) 98.1 F (36.7 C) (Oral)  Ht 5\' 5"  (1.651 m)  Wt 306 lb (138.801 kg)  BMI 50.92 kg/m2  SpO2 97% General:   Well developed, well nourished . NAD.  HEENT:  Normocephalic . Face symmetric, atraumatic   Lips and tongue: Normal Skin: Not pale. Not jaundice. Arms: Multiple skin-colored, papular, 2 or 3 mm bumps. Rest of the skin is normal Neurologic:  alert & oriented X3.  Speech normal, gait appropriate for age and unassisted Psych--  Cognition and judgment appear intact.  Cooperative with normal attention span and concentration.  Behavior appropriate. No anxious or depressed appearing.      Assessment & Plan:   Problem list > DM Hyperlipidemia Asthma Insomnia GERD Chronic kidney disease H/o abnormal brain MRI H/o  depression H/o infertility H/o close heart surgery at [facility], PFO repair?  A/P Allergic reaction: Suspect a reaction to metformin. Plan: Discontinue metformin, prednisone, Benadryl, Zantac, knows to call me if not better, check CBGs to be sure they are not increasing w/ steroids . See AVS She likes a flu shot, recommend to first be treated for the reaction then take the flu shot.

## 2014-12-25 NOTE — Assessment & Plan Note (Signed)
Allergic reaction: Suspect a reaction to metformin. Plan: Discontinue metformin, prednisone, Benadryl, Zantac, knows to call me if not better, check CBGs to be sure they are not increasing w/ steroids . See AVS She likes a flu shot, recommend to first be treated for the reaction then take the flu shot.

## 2014-12-25 NOTE — Progress Notes (Signed)
Pre visit review using our clinic review tool, if applicable. No additional management support is needed unless otherwise documented below in the visit note. 

## 2014-12-25 NOTE — Patient Instructions (Addendum)
Benadryl OTC as needed for tension Zantac 75 milligrams one tablet twice a day until better Take prednisone as prescribed  Check your blood sugars 3 times over the next few days to be sure the sugars not increasing too much with prednisone. If it is more than 180 please let me know  Call if you are not improving in the next few days  Get a flu shot once you are better

## 2015-01-20 ENCOUNTER — Ambulatory Visit (INDEPENDENT_AMBULATORY_CARE_PROVIDER_SITE_OTHER): Payer: BC Managed Care – PPO | Admitting: Internal Medicine

## 2015-01-20 ENCOUNTER — Encounter: Payer: Self-pay | Admitting: Internal Medicine

## 2015-01-20 VITALS — BP 126/76 | HR 74 | Temp 98.0°F | Ht 65.0 in | Wt 308.2 lb

## 2015-01-20 DIAGNOSIS — Z114 Encounter for screening for human immunodeficiency virus [HIV]: Secondary | ICD-10-CM

## 2015-01-20 DIAGNOSIS — Z09 Encounter for follow-up examination after completed treatment for conditions other than malignant neoplasm: Secondary | ICD-10-CM

## 2015-01-20 DIAGNOSIS — E119 Type 2 diabetes mellitus without complications: Secondary | ICD-10-CM

## 2015-01-20 DIAGNOSIS — Z0000 Encounter for general adult medical examination without abnormal findings: Secondary | ICD-10-CM | POA: Diagnosis not present

## 2015-01-20 DIAGNOSIS — D352 Benign neoplasm of pituitary gland: Secondary | ICD-10-CM

## 2015-01-20 DIAGNOSIS — Z23 Encounter for immunization: Secondary | ICD-10-CM

## 2015-01-20 NOTE — Assessment & Plan Note (Signed)
Td 09; pnm 23: today ; Flu shot today  Sees gyn Labs Diet exercise discussed

## 2015-01-20 NOTE — Progress Notes (Signed)
Pre visit review using our clinic review tool, if applicable. No additional management support is needed unless otherwise documented below in the visit note. 

## 2015-01-20 NOTE — Progress Notes (Signed)
Subjective:    Patient ID: Lauren Johns, female    DOB: 1970/08/18, 44 y.o.   MRN: [id number]  DOS:  01/20/2015 Type of visit - description : CPX Interval history: In general doing well, allergic reaction resolved    Review of Systems Constitutional: No fever. No chills. No unexplained wt changes. No unusual sweats  HEENT: No dental problems, no ear discharge, no facial swelling, no voice changes. No eye discharge, no eye  redness , no  intolerance to light   Respiratory: No wheezing , no  difficulty breathing. No cough , no mucus production  Cardiovascular: No CP, no leg swelling , no  Palpitations  GI: no nausea, no vomiting, no diarrhea , no  abdominal pain.  No blood in the stools. No dysphagia, no odynophagia    Endocrine: No polyphagia, no polyuria , no polydipsia  GU: No dysuria, gross hematuria, difficulty urinating. No urinary urgency, no frequency.  Musculoskeletal: No joint swellings or unusual aches or pains  Skin: No change in the color of the skin, palor , no  Rash  Allergic, immunologic: No environmental allergies , no  food allergies  Neurological: No dizziness no  syncope. No headaches. No diplopia, no slurred, no slurred speech, no motor deficits, no facial  Numbness  Hematological: No enlarged lymph nodes, no easy bruising , no unusual bleedings  Psychiatry: No suicidal ideas, no hallucinations, no beavior problems, no confusion.  No unusual/severe anxiety, no depression   Past Medical History  Diagnosis Date  . Asthma   . Depression     h/o   . GERD (gastroesophageal reflux disease)   . Insomnia   . Obesity   . PFO (patent foramen ovale)     ??PFO repair:  in the 90s, was seen several times after and released (per patient)  . Arthritis   . Chronic kidney disease   . Elevated lipids   . Abnormal brain MRI   . Allergic rhinitis 05/21/2013  . Diabetes ([place]) 04/03/2013  . Infertility, female     h/o    Past Surgical History  Procedure  Laterality Date  . Closed hearty valve repair  1993    [place] , PFO repair??    Social History   Social History  . Marital Status: Married    Spouse Name: N/A  . Number of Children: 0  . Years of Education: N/A   Occupational History  . school teacher     Social History Main Topics  . Smoking status: Never Smoker   . Smokeless tobacco: Never Used  . Alcohol Use: Yes     Comment: socially   . Drug Use: No  . Sexual Activity: Not on file   Other Topics Concern  . Not on file   Social History Narrative   mother is Elodia Florence, one of my patients    Lives w/ husband     Family History  Problem Relation Age of Onset  . Heart attack Father 46  . Diabetes Mother   . Hypertension Mother     M, F  . Prostate cancer Maternal Grandfather   . Cancer Maternal Grandfather     late in life   . Breast cancer Neg Hx        Medication List       This list is accurate as of: 01/20/15 11:59 PM.  Always use your most recent med list.  albuterol 108 (90 BASE) MCG/ACT inhaler  Commonly known as:  VENTOLIN HFA  Inhale 1 puff into the lungs every 6 (six) hours as needed for wheezing or shortness of breath.     budesonide-formoterol 160-4.5 MCG/ACT inhaler  Commonly known as:  SYMBICORT  Inhale 2 puffs into the lungs 2 (two) times daily.     celecoxib 200 MG capsule  Commonly known as:  CELEBREX  Take 1 capsule (200 mg total) by mouth 2 (two) times daily.           Objective:   Physical Exam BP 126/76 mmHg  Pulse 74  Temp(Src) 98 F (36.7 C) (Oral)  Ht 5\' 5"  (1.651 m)  Wt 308 lb 4 oz (139.821 kg)  BMI 51.30 kg/m2  SpO2 97% General:   Well developed, well nourished . NAD.  Neck:  No thyromegaly HEENT:  Normocephalic . Face symmetric, atraumatic Lungs:  CTA B Normal respiratory effort, no intercostal retractions, no accessory muscle use. Heart: RRR,  no murmur.  No pretibial edema bilaterally  Abdomen:  Not distended, soft,  non-tender. No rebound or rigidity.   Skin: Exposed areas without rash. Not pale. Not jaundice Neurologic:  alert & oriented X3.  Speech normal, gait appropriate for age and unassisted Strength symmetric and appropriate for age.  Psych: Cognition and judgment appear intact.  Cooperative with normal attention span and concentration.  Behavior appropriate. No anxious or depressed appearing.    Assessment & Plan:  Assessment >> DM aic 6.6 (02-2014) Hyperlipidemia Asthma Insomnia GERD H/o abnormal brain MRI-- pituitary adenoma used to see Dr Trenton Gammon H/o  depression H/o infertility H/o close heart surgery at [place], PFO repair?Echo 10-2011: wnl  Plan Allergic reaction: Resolved DM: Diet and exercise discussed, check A1c Hyperlipidemia: Last LDL is elevated, diet and exercise, recheck on return to the office Asthma: Well control, using rescue inhalers less than 3 times a week. Pituitary adenomas: Refer back to Dr. Trenton Gammon RTC 6-8 months

## 2015-01-20 NOTE — Patient Instructions (Signed)
Get your blood work before you leave    Next visit  for a routine check up in 6-8 months Please schedule an appointment at the front desk Please come back fasting

## 2015-01-21 LAB — HIV ANTIBODY (ROUTINE TESTING W REFLEX): HIV 1&2 Ab, 4th Generation: NONREACTIVE

## 2015-01-21 LAB — COMPREHENSIVE METABOLIC PANEL
ALK PHOS: 82 U/L (ref 39–117)
ALT: 19 U/L (ref 0–35)
AST: 22 U/L (ref 0–37)
Albumin: 4.1 g/dL (ref 3.5–5.2)
BILIRUBIN TOTAL: 0.4 mg/dL (ref 0.2–1.2)
BUN: 10 mg/dL (ref 6–23)
CALCIUM: 9.7 mg/dL (ref 8.4–10.5)
CO2: 28 meq/L (ref 19–32)
CREATININE: 0.92 mg/dL (ref 0.40–1.20)
Chloride: 103 mEq/L (ref 96–112)
GFR: 85.95 mL/min (ref >60.00)
GLUCOSE: 79 mg/dL (ref 70–99)
Potassium: 4.1 mEq/L (ref 3.5–5.1)
Sodium: 139 mEq/L (ref 135–145)
TOTAL PROTEIN: 7.6 g/dL (ref 6.0–8.3)

## 2015-01-21 LAB — CBC WITH DIFFERENTIAL/PLATELET
BASOS ABS: 0.1 10*3/uL (ref 0.0–0.1)
Basophils Relative: 1.4 % (ref 0.0–3.0)
Eosinophils Absolute: 0.1 10*3/uL (ref 0.0–0.7)
Eosinophils Relative: 2.3 % (ref 0.0–5.0)
HCT: 36.4 % (ref 36.0–46.0)
Hemoglobin: 11.9 g/dL — ABNORMAL LOW (ref 12.0–15.0)
Lymphocytes Relative: 28.9 % (ref 12.0–46.0)
Lymphs Abs: 1.7 10*3/uL (ref 0.7–4.0)
MCHC: 32.7 g/dL (ref 30.0–36.0)
MCV: 91.4 fl (ref 78.0–100.0)
MONOS PCT: 10 % (ref 3.0–12.0)
Monocytes Absolute: 0.6 10*3/uL (ref 0.1–1.0)
Neutro Abs: 3.3 10*3/uL (ref 1.4–7.7)
Neutrophils Relative %: 57.4 % (ref 43.0–77.0)
Platelets: 292 10*3/uL (ref 150.0–400.0)
RBC: 3.98 Mil/uL (ref 3.87–5.11)
RDW: 14.9 % (ref 11.5–15.5)
WBC: 5.8 10*3/uL (ref 4.0–10.5)

## 2015-01-21 LAB — HEMOGLOBIN A1C: HEMOGLOBIN A1C: 6.3 % (ref 4.6–6.5)

## 2015-01-26 NOTE — Assessment & Plan Note (Signed)
Allergic reaction: Resolved DM: Diet and exercise discussed, check A1c Hyperlipidemia: Last LDL is elevated, diet and exercise, recheck on return to the office Asthma: Well control, using rescue inhalers less than 3 times a week. Pituitary adenomas: Refer back to Dr. Trenton Gammon RTC 6-8 months

## 2015-02-12 ENCOUNTER — Other Ambulatory Visit: Payer: Self-pay | Admitting: Neurosurgery

## 2015-02-12 DIAGNOSIS — D352 Benign neoplasm of pituitary gland: Secondary | ICD-10-CM

## 2015-02-26 ENCOUNTER — Ambulatory Visit
Admission: RE | Admit: 2015-02-26 | Discharge: 2015-02-26 | Disposition: A | Discharge status: Home / Self Care | Payer: BC Managed Care – PPO | Source: Ambulatory Visit | Attending: Neurosurgery | Admitting: Neurosurgery

## 2015-02-26 DIAGNOSIS — D352 Benign neoplasm of pituitary gland: Secondary | ICD-10-CM

## 2015-02-26 MED ORDER — GADOBENATE DIMEGLUMINE 529 MG/ML IV SOLN
10.0000 mL | Freq: Once | INTRAVENOUS | Status: AC | PRN
  Administered 2015-02-26: 10 mL via INTRAVENOUS

## 2015-05-13 ENCOUNTER — Telehealth: Payer: Self-pay | Admitting: Internal Medicine

## 2015-05-13 NOTE — Telephone Encounter (Signed)
Patient Name: Lauren Johns DOB: 10-17-1970 Initial Comment Caller states she has blood in her stool. Nurse Assessment Nurse: Vallery Sa, RN, Cathy Date/Time (Eastern Time): 05/13/2015 1:09:13 PM Confirm and document reason for call. If symptomatic, describe symptoms. You must click the next button to save text entered. ---Caller states she developed blood in her stool 2 days ago. No injury in the past 3 days. No fever. Has the patient traveled out of the country within the last 30 days? ---No Does the patient have any new or worsening symptoms? ---Yes Will a triage be completed? ---Yes Related visit to physician within the last 2 weeks? ---No Does the PT have any chronic conditions? (i.e. diabetes, asthma, etc.) ---Yes List chronic conditions. ---Asthma, Pre-Diabetes Did the patient indicate they were pregnant? ---No Is this a behavioral health or substance abuse call? ---No Guidelines Guideline Title Affirmed Question Affirmed Notes Rectal Bleeding MODERATE rectal bleeding (small blood clots, passing blood without stool, or toilet water turns red) Final Disposition User See Physician within [place], RN, Tye [place] Comments Already scheduled for 8:45am appointment tomorrow morning with Dr. Larose Kells. Referrals REFERRED TO PCP OFFICE Disagree/Comply: Comply

## 2015-05-13 NOTE — Telephone Encounter (Signed)
Appt noted

## 2015-05-13 NOTE — Telephone Encounter (Signed)
Pt called in to schedule an appt with PCP for blood in stool. Offered pt an appt for today with a different provider. She says that she can wait until in the morning to see PCP (scheduled appt) Transferred pt to [facility] to speak with a nurse.

## 2015-05-14 ENCOUNTER — Ambulatory Visit (INDEPENDENT_AMBULATORY_CARE_PROVIDER_SITE_OTHER): Payer: BC Managed Care – PPO | Admitting: Internal Medicine

## 2015-05-14 ENCOUNTER — Encounter: Payer: Self-pay | Admitting: Internal Medicine

## 2015-05-14 VITALS — BP 122/78 | HR 61 | Temp 98.4°F | Ht 65.0 in | Wt 308.1 lb

## 2015-05-14 DIAGNOSIS — K648 Other hemorrhoids: Secondary | ICD-10-CM | POA: Diagnosis not present

## 2015-05-14 NOTE — Progress Notes (Signed)
Subjective:    Patient ID: Lauren Johns, female    DOB: 09/15/1970, 44 y.o.   MRN: [id number]  DOS:  05/14/2015 Type of visit - description : Acute visit Interval history: 3 days ago saw blood in the toilette paper after a bowel movement. Blood was fresh , 2-3 mls?. Stools normal. Similar sx 2 days ago.  No blood yesterday. There was no associated   anal pain or itching.   Review of Systems No fever chills No abdominal pain No nausea, vomiting, diarrhea. No vaginal discharge or bleeding.   Past Medical History  Diagnosis Date  . Asthma   . Depression     h/o   . GERD (gastroesophageal reflux disease)   . Insomnia   . Obesity   . PFO (patent foramen ovale)     ??PFO repair:  in the 90s, was seen several times after and released (per patient)  . Arthritis   . Chronic kidney disease   . Elevated lipids   . Abnormal brain MRI   . Allergic rhinitis 05/21/2013  . Diabetes ([place]) 04/03/2013  . Infertility, female     h/o    Past Surgical History  Procedure Laterality Date  . Closed hearty valve repair  1993    [place] , PFO repair??    Social History   Social History  . Marital Status: Married    Spouse Name: N/A  . Number of Children: 0  . Years of Education: N/A   Occupational History  . school teacher     Social History Main Topics  . Smoking status: Never Smoker   . Smokeless tobacco: Never Used  . Alcohol Use: Yes     Comment: socially   . Drug Use: No  . Sexual Activity: Not on file   Other Topics Concern  . Not on file   Social History Narrative   mother is Elodia Florence, one of my patients    Lives w/ husband        Medication List       This list is accurate as of: 05/14/15  3:18 PM.  Always use your most recent med list.               albuterol 108 (90 Base) MCG/ACT inhaler  Commonly known as:  VENTOLIN HFA  Inhale 1 puff into the lungs every 6 (six) hours as needed for wheezing or shortness of breath.     budesonide-formoterol  160-4.5 MCG/ACT inhaler  Commonly known as:  SYMBICORT  Inhale 2 puffs into the lungs 2 (two) times daily.     celecoxib 200 MG capsule  Commonly known as:  CELEBREX  Take 1 capsule (200 mg total) by mouth 2 (two) times daily.           Objective:   Physical Exam BP 122/78 mmHg  Pulse 61  Temp(Src) 98.4 F (36.9 C) (Oral)  Ht 5\' 5"  (1.651 m)  Wt 308 lb 2 oz (139.765 kg)  BMI 51.27 kg/m2  SpO2 94% General:   Well developed, well nourished . NAD.  HEENT:  Normocephalic . Face symmetric, atraumatic  Abdomen:  Not distended, soft, non-tender. No rebound or rigidity. DRE: Normal external examination, rectal exam with no stools found, no mass Anoscopy: Small internal hemorrhoids Skin: Not pale. Not jaundice Neurologic:  alert & oriented X3.  Speech normal, gait appropriate for age and unassisted Psych--  Cognition and judgment appear intact.  Cooperative with normal attention span and concentration.  Behavior appropriate. No anxious or depressed appearing.    Assessment & Plan:   Assessment >> DM aic 6.6 (02-2014) Hyperlipidemia Asthma Insomnia GERD Int hemorrhoids dx 05-2015  H/o abnormal brain MRI-- pituitary adenoma used to see Dr Trenton Gammon H/o  depression H/o infertility H/o close heart surgery at [place], PFO repair?Echo 10-2011: wnl  PLAN: Rectal bleeding: Likely due to internal hemorrhoids, no other symptoms, recommend observation. If she has some pain or discomfort use OTC hemorrhoid creams. Increase fiber intake, Metamucil, call if symptoms severe. RTC April 2017 as scheduled

## 2015-05-14 NOTE — Progress Notes (Signed)
Pre visit review using our clinic review tool, if applicable. No additional management support is needed unless otherwise documented below in the visit note. 

## 2015-05-14 NOTE — Patient Instructions (Signed)

## 2015-07-11 ENCOUNTER — Telehealth: Payer: Self-pay | Admitting: *Deleted

## 2015-07-11 NOTE — Telephone Encounter (Signed)
If patient is okay with it, okay to switch to [facility] 100/5:2 puffs twice a day. Send a new prescription

## 2015-07-11 NOTE — Telephone Encounter (Signed)
Received fax from [place] regarding Symbicort Inhaler, no longer covered by [employer]; Must use Preferred Brand: Advair, Adair Patter, or Dulera/SLSL 03/31

## 2015-07-14 MED ORDER — MOMETASONE FURO-FORMOTEROL FUM 100-5 MCG/ACT IN AERO: 2.0000 | INHALATION_SPRAY | Freq: Two times a day (BID) | RESPIRATORY_TRACT | Status: DC

## 2015-07-14 NOTE — Telephone Encounter (Signed)
New Rx request to pharmacy; **CHANGE IN THERAPY D/T INSURANCE COVERAGE**; LMOM with contact name and number [for return call, if Needed] RE: change in medication per provider instructions/SLS 04/03

## 2015-07-17 LAB — HM PAP SMEAR

## 2015-07-21 ENCOUNTER — Encounter: Payer: Self-pay | Admitting: Internal Medicine

## 2015-07-21 ENCOUNTER — Telehealth: Payer: Self-pay | Admitting: Internal Medicine

## 2015-07-21 ENCOUNTER — Ambulatory Visit (INDEPENDENT_AMBULATORY_CARE_PROVIDER_SITE_OTHER): Payer: BC Managed Care – PPO | Admitting: Internal Medicine

## 2015-07-21 DIAGNOSIS — D649 Anemia, unspecified: Secondary | ICD-10-CM | POA: Diagnosis not present

## 2015-07-21 DIAGNOSIS — Z09 Encounter for follow-up examination after completed treatment for conditions other than malignant neoplasm: Secondary | ICD-10-CM

## 2015-07-21 DIAGNOSIS — E119 Type 2 diabetes mellitus without complications: Secondary | ICD-10-CM | POA: Diagnosis not present

## 2015-07-21 DIAGNOSIS — E785 Hyperlipidemia, unspecified: Secondary | ICD-10-CM | POA: Diagnosis not present

## 2015-07-21 LAB — CBC WITH DIFFERENTIAL/PLATELET
BASOS PCT: 0.5 % (ref 0.0–3.0)
Basophils Absolute: 0 10*3/uL (ref 0.0–0.1)
EOS ABS: 0.1 10*3/uL (ref 0.0–0.7)
EOS PCT: 2.3 % (ref 0.0–5.0)
HEMATOCRIT: 34.7 % — AB (ref 36.0–46.0)
HEMOGLOBIN: 11.5 g/dL — AB (ref 12.0–15.0)
LYMPHS PCT: 21.3 % (ref 12.0–46.0)
Lymphs Abs: 1.1 10*3/uL (ref 0.7–4.0)
MCHC: 33 g/dL (ref 30.0–36.0)
MCV: 89.9 fl (ref 78.0–100.0)
MONOS PCT: 10.9 % (ref 3.0–12.0)
Monocytes Absolute: 0.6 10*3/uL (ref 0.1–1.0)
NEUTROS ABS: 3.3 10*3/uL (ref 1.4–7.7)
Neutrophils Relative %: 65 % (ref 43.0–77.0)
PLATELETS: 301 10*3/uL (ref 150.0–400.0)
RBC: 3.86 Mil/uL — ABNORMAL LOW (ref 3.87–5.11)
RDW: 15.5 % (ref 11.5–15.5)
WBC: 5.1 10*3/uL (ref 4.0–10.5)

## 2015-07-21 LAB — HEMOGLOBIN A1C: Hgb A1c MFr Bld: 6.4 % (ref 4.6–6.5)

## 2015-07-21 LAB — LIPID PANEL
CHOLESTEROL: 203 mg/dL — AB (ref 0–200)
HDL: 37.9 mg/dL — ABNORMAL LOW (ref >39.00)
LDL CALC: 143 mg/dL — AB (ref 0–99)
NonHDL: 165.36
TRIGLYCERIDES: 112 mg/dL (ref 0.0–149.0)
Total CHOL/HDL Ratio: 5
VLDL: 22.4 mg/dL (ref 0.0–40.0)

## 2015-07-21 LAB — MICROALBUMIN / CREATININE URINE RATIO
Creatinine,U: 173.6 mg/dL
Microalb Creat Ratio: 0.4 mg/g (ref 0.0–30.0)

## 2015-07-21 MED ORDER — ALBUTEROL SULFATE HFA 108 (90 BASE) MCG/ACT IN AERS: 1.0000 | INHALATION_SPRAY | Freq: Four times a day (QID) | RESPIRATORY_TRACT | Status: DC | PRN

## 2015-07-21 NOTE — Progress Notes (Signed)
Pre visit review using our clinic review tool, if applicable. No additional management support is needed unless otherwise documented below in the visit note. 

## 2015-07-21 NOTE — Progress Notes (Signed)
Subjective:    Patient ID: Lauren Johns, female    DOB: 20-Feb-1971, 45 y.o.   MRN: [id number]  DOS:  07/21/2015 Type of visit - description : Routine checkup Interval history: Asthma: Well controlled with dulera, still having occasional wheezing, has not used her rescue inhaler. Obesity: Continue to increase her weight, when asked, admits to snoring and lack of energy. Diabetes: No ambulatory CBGs.  Wt Readings from Last 3 Encounters:  07/21/15 310 lb 8 oz (140.842 kg)  05/14/15 308 lb 2 oz (139.765 kg)  02/26/15 308 lb (139.708 kg)     Review of Systems  Denies anxiety or depression Past Medical History  Diagnosis Date  . Asthma   . Depression     h/o   . GERD (gastroesophageal reflux disease)   . Insomnia   . Obesity   . PFO (patent foramen ovale)     ??PFO repair:  in the 90s, was seen several times after and released (per patient)  . Arthritis   . Chronic kidney disease   . Elevated lipids   . Abnormal brain MRI   . Allergic rhinitis 05/21/2013  . Diabetes ([place]) 04/03/2013  . Infertility, female     h/o    Past Surgical History  Procedure Laterality Date  . Closed hearty valve repair  1993    [place] , PFO repair??    Social History   Social History  . Marital Status: Married    Spouse Name: N/A  . Number of Children: 0  . Years of Education: N/A   Occupational History  . school teacher     Social History Main Topics  . Smoking status: Never Smoker   . Smokeless tobacco: Never Used  . Alcohol Use: Yes     Comment: socially   . Drug Use: No  . Sexual Activity: Not on file   Other Topics Concern  . Not on file   Social History Narrative   mother is Elodia Florence, one of my patients    Lives w/ husband        Medication List       This list is accurate as of: 07/21/15 10:07 AM.  Always use your most recent med list.               albuterol 108 (90 Base) MCG/ACT inhaler  Commonly known as:  VENTOLIN HFA  Inhale 1 puff into the  lungs every 6 (six) hours as needed for wheezing or shortness of breath.     mometasone-formoterol 100-5 MCG/ACT Aero  Commonly known as:  DULERA  Inhale 2 puffs into the lungs 2 (two) times daily.           Objective:   Physical Exam BP 122/66 mmHg  Pulse 62  Temp(Src) 98.4 F (36.9 C) (Oral)  Ht 5\' 5"  (1.651 m)  Wt 310 lb 8 oz (140.842 kg)  BMI 51.67 kg/m2  SpO2 96% General:   Well developed, well nourished . NAD.  HEENT:  Normocephalic . Face symmetric, atraumatic Lungs:  CTA B Normal respiratory effort, no intercostal retractions, no accessory muscle use. Heart: RRR,  no murmur.  No pretibial edema bilaterally  DIABETIC FEET EXAM: No lower extremity edema Normal pedal pulses bilaterally Skin normal, nails normal, no calluses Pinprick examination of the feet normal. Neurologic:  alert & oriented X3.  Speech normal, gait appropriate for age and unassisted Psych--  Cognition and judgment appear intact.  Cooperative with normal attention span and concentration.  Behavior appropriate. No anxious or depressed appearing.      Assessment & Plan:   Assessment >> DM aic 6.6 (02-2014) Hyperlipidemia Asthma Insomnia GERD Morbid obesity  Weight: 2008 281 pounds, 2009 275 pounds,  , 10/22/2011 293  Int hemorrhoids dx 05-2015  H/o abnormal brain MRI-- pituitary adenoma used to see Dr Trenton Gammon H/o  depression Birth control --> H/o infertility H/o close heart surgery at [place], PFO repair?Echo 10-2011: wnl  PLAN: DM: foot exam negative today, check a a1c, diet and exercise discussed Hyperlipidemia: Currently on no meds, check FLP Asthma: on Dulera twice a day, occasional wheezing, not using albuterol, encouraged to use rescue inhalers prngo Morbid Obesity: Advised patient this is her most prominent health issue. Discussed diet, exercise, 2 FDA approved meds  and bariatric surgery. See instructions. Snoring, fatigue--- epworth scale 8 (average) Mild anemia:  Periods are irregular heavy. Recheck a CBC. To start BCP by gynecology for irregular periods. RTC 4 months   Today, I spent more than   25 min with the patient: >50% of the time counseling regards diet, exercise, weight loss medicines and bariatric surgery

## 2015-07-21 NOTE — Patient Instructions (Signed)
GO TO THE LAB :      Get the blood work     GO TO THE FRONT DESK  Schedule your next appointment for a  Check up When?   4 months Fasting?  No  Think about Qsymia and Belviq

## 2015-07-21 NOTE — Telephone Encounter (Signed)
Can be reached: [phone] Pharmacy: [id number], [place]  Reason for call: Pt states she would like to try the Qsymia for weight loss.

## 2015-07-21 NOTE — Assessment & Plan Note (Signed)
DM: foot exam negative today, check a a1c, diet and exercise discussed Hyperlipidemia: Currently on no meds, check FLP Asthma: on Dulera twice a day, occasional wheezing, not using albuterol, encouraged to use rescue inhalers prngo Morbid Obesity: Advised patient this is her most prominent health issue. Discussed diet, exercise, 2 FDA approved meds  and bariatric surgery. See instructions. Snoring, fatigue--- epworth scale 8 (average) Mild anemia: Periods are irregular heavy. Recheck a CBC. To start BCP by gynecology for irregular periods. RTC 4 months

## 2015-07-22 MED ORDER — PHENTERMINE-TOPIRAMATE ER 3.75-23 MG PO CP24: 1.0000 | ORAL_CAPSULE | ORAL | Status: DC

## 2015-07-22 MED ORDER — PHENTERMINE-TOPIRAMATE ER 7.5-46 MG PO CP24: 1.0000 | ORAL_CAPSULE | ORAL | Status: DC

## 2015-07-22 NOTE — Telephone Encounter (Signed)
Advise patient, labs are okay, cholesterol is a slightly elevated, should get better once she starts losing weight. Okay to start Qsymia, has 2 prescriptions. The first 2 weeks taking the lowest dose (3.75), the next 4 weeks take the higher dose Arrange a follow-up in 4 weeks. Watch for side effects.

## 2015-07-22 NOTE — Telephone Encounter (Signed)
Spoke w/ Pt, instructed her on how to take Qsymia, and to follow-up in 4 weeks, appt scheduled on 08/22/2015. Informed her of lab results. Pt verbalized understanding.

## 2015-08-22 ENCOUNTER — Ambulatory Visit: Payer: BC Managed Care – PPO | Admitting: Internal Medicine

## 2015-08-27 ENCOUNTER — Telehealth: Payer: Self-pay | Admitting: Internal Medicine

## 2015-08-27 NOTE — Telephone Encounter (Signed)
Spoke w/ Pt, informed her of options below. She has agreed to try Contrave. Informed her I was also placing the discount coupon at the front desk for her to pick up at her convenience.

## 2015-08-27 NOTE — Telephone Encounter (Signed)
Per online formulary for [employer], coverage for [place], [place], and Contrave with PA's.

## 2015-08-27 NOTE — Telephone Encounter (Signed)
Next steps:: Pt to get a coupon to make it more affordable Call her insurance and see if BELVIQ , CONTRIVE, SAXENDA are cover . Please provide this names to the pt.

## 2015-08-27 NOTE — Telephone Encounter (Signed)
Have not received anything from pharmacy regarding this. Please advise.

## 2015-08-27 NOTE — Telephone Encounter (Signed)
°  Relationship to patient: Self Can be reached: [phone]  Reason for call: Patient called to follow up on weight loss medication that Dr. Larose Kells was suppose to contact her about. States [facility] sent the order back to Provider because patient could not afford $200 and she is waiting to find out what the next step will be.

## 2015-08-28 MED ORDER — NALTREXONE-BUPROPION HCL ER 8-90 MG PO TB12: ORAL_TABLET | ORAL | Status: DC

## 2015-08-28 NOTE — Addendum Note (Signed)
Addended by: Kathlene November E on: 08/28/2015 09:06 AM   Modules accepted: Orders, Medications

## 2015-08-28 NOTE — Telephone Encounter (Addendum)
Okay, to start Contreve, the first week take 1 tablet daily only then one tablet twice a day. Needs to work on her diet at the same time Please advise she cannot take this medication if pregnant Follow-up 3-4 weeks

## 2015-08-28 NOTE — Telephone Encounter (Signed)
Rx faxed to [facility]. LM informing Pt to call and schedule appt in 3-4 weeks.

## 2015-08-29 NOTE — Telephone Encounter (Signed)
Can be reached: [phone] (cell #) Pharmacy: [facility], [place]  Reason for call: Pharmacy told pt the contrave required PA and form was faxed to provider. Pt was told to notify us to request the PA.

## 2015-09-01 NOTE — Telephone Encounter (Signed)
Prior authorization initiated on [email or web address]. Awaiting determination. JG//CMA

## 2015-09-04 NOTE — Telephone Encounter (Signed)
PA approved effective 09/01/2015 - 01/02/2016. Approval letter sent for scanning. JG//CMA

## 2015-09-18 NOTE — Telephone Encounter (Signed)
Caller name: Prudencia Relationship to patient: self Can be reached: [phone]   Reason for call: pt called in stating she read pamphlet that came with Contrave and states not to take if you have tumors. She has a pituitary tumor and is wondering if she should take this med or not and wanting Dr. Ethel Rana opinion. Pt requesting call back.

## 2015-09-18 NOTE — Telephone Encounter (Signed)
Please advise 

## 2015-09-19 NOTE — Telephone Encounter (Signed)
Spoke w/ Pt, informed her of below and informed her that I would be mailing information about Contrave. Pt verbalized understanding.

## 2015-09-19 NOTE — Telephone Encounter (Signed)
i review up-to-date, no mention of contraindications for pituitary tumors, it has a number of other s/e. Please mail pt the  drug information from UTD I printed

## 2015-09-24 ENCOUNTER — Ambulatory Visit (INDEPENDENT_AMBULATORY_CARE_PROVIDER_SITE_OTHER): Payer: BC Managed Care – PPO | Admitting: Sports Medicine

## 2015-09-24 ENCOUNTER — Ambulatory Visit
Admission: RE | Admit: 2015-09-24 | Discharge: 2015-09-24 | Disposition: A | Discharge status: Home / Self Care | Payer: BC Managed Care – PPO | Source: Ambulatory Visit | Attending: Sports Medicine | Admitting: Sports Medicine

## 2015-09-24 ENCOUNTER — Encounter: Payer: Self-pay | Admitting: Sports Medicine

## 2015-09-24 VITALS — BP 118/57 | Ht 65.0 in | Wt 299.0 lb

## 2015-09-24 DIAGNOSIS — M79672 Pain in left foot: Secondary | ICD-10-CM

## 2015-09-24 MED ORDER — DICLOFENAC SODIUM 75 MG PO TBEC: DELAYED_RELEASE_TABLET | ORAL | Status: DC
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
## 2015-09-26 ENCOUNTER — Telehealth: Payer: Self-pay | Admitting: Sports Medicine

## 2015-09-26 NOTE — Telephone Encounter (Signed)
I spoke with the patient on the phone today after reviewing x-rays of her left foot. No evidence of old or new fracture. No significant degenerative changes. She does have a rather wide forefoot. She will proceed with treatment as outlined in my office note from earlier this week and will follow-up with me in one month for reevaluation.

## 2015-10-28 ENCOUNTER — Ambulatory Visit (INDEPENDENT_AMBULATORY_CARE_PROVIDER_SITE_OTHER): Payer: BC Managed Care – PPO | Admitting: Sports Medicine

## 2015-10-28 ENCOUNTER — Encounter: Payer: Self-pay | Admitting: Sports Medicine

## 2015-10-28 VITALS — BP 112/59 | Ht 65.0 in | Wt 299.0 lb

## 2015-10-28 DIAGNOSIS — M79672 Pain in left foot: Secondary | ICD-10-CM | POA: Diagnosis not present

## 2015-10-28 NOTE — Progress Notes (Signed)
   Subjective:    Patient ID: Lauren Johns, female    DOB: [birth year], 45 y.o.   MRN: [id number]  HPI   Patient comes in today for follow-up on bilateral foot pain. Pain and swelling improved on diclofenac. She has also purchased a pair of wider tennis shoes. X-rays of her more symptomatic left foot show no significant arthritis. No signs of remote fracture. She does have a wide forefoot. She found the arch strap to be ill fitting but was able to purchase a better compression type ankle/foot brace which is more comfortable.    Review of Systems     Objective:   Physical Exam  Well-developed, well-nourished. No acute distress  Examination of each foot is basically unchanged from the exam on June 14. She has loss of the transverse arch bilaterally without splaying of her toes. Wide forefoot is noted. Fairly well-preserved longitudinal arch. No swelling. No tenderness to palpation. Neurovascularly intact distally. Walking without a limp.  X-rays as above.       Assessment & Plan:   Improved Bilateral foot pain, left greater than right secondary to a wide forefoot  At this point I do not believe we need to pursue any further workup or treatment. I recommended that she try to wear her well cushioned, wide toe box tennis shoes as much as possible. She may continue with Voltaren and compression when necessary. I did explain to her that we could consider some sort of insert in her tennis shoes at a later date if needed. Continue with activity as tolerated and follow-up as needed.

## 2015-11-16 ENCOUNTER — Encounter (HOSPITAL_COMMUNITY): Payer: Self-pay | Admitting: Oncology

## 2015-11-16 ENCOUNTER — Emergency Department (HOSPITAL_COMMUNITY)
Admission: EM | Admit: 2015-11-16 | Discharge: 2015-11-17 | Disposition: A | Discharge status: Home / Self Care | Payer: BC Managed Care – PPO | Attending: Emergency Medicine | Admitting: Emergency Medicine

## 2015-11-16 DIAGNOSIS — Z7984 Long term (current) use of oral hypoglycemic drugs: Secondary | ICD-10-CM | POA: Diagnosis not present

## 2015-11-16 DIAGNOSIS — E1122 Type 2 diabetes mellitus with diabetic chronic kidney disease: Secondary | ICD-10-CM | POA: Diagnosis not present

## 2015-11-16 DIAGNOSIS — N189 Chronic kidney disease, unspecified: Secondary | ICD-10-CM | POA: Insufficient documentation

## 2015-11-16 DIAGNOSIS — Z79899 Other long term (current) drug therapy: Secondary | ICD-10-CM | POA: Diagnosis not present

## 2015-11-16 DIAGNOSIS — J45909 Unspecified asthma, uncomplicated: Secondary | ICD-10-CM | POA: Diagnosis not present

## 2015-11-16 DIAGNOSIS — J029 Acute pharyngitis, unspecified: Secondary | ICD-10-CM | POA: Diagnosis present

## 2015-11-16 NOTE — ED Triage Notes (Signed)
Pt c/o sore throat, neck pain and shoulder pain.  States she was seen at urgent care where a step test was negative.  Pt sent home w/ muscle relaxer and ibuprofen.  Pt has been taking meds w/o relief.

## 2015-11-17 MED ORDER — DEXAMETHASONE 2 MG PO TABS
6.0000 mg | ORAL_TABLET | Freq: Once | ORAL | Status: AC
  Administered 2015-11-17: 6 mg via ORAL
  Filled 2015-11-17: qty 1

## 2015-11-17 MED ORDER — AZITHROMYCIN 250 MG PO TABS: 250.0000 mg | ORAL_TABLET | Freq: Every day | ORAL | 0 refills | Status: DC

## 2015-11-17 NOTE — ED Provider Notes (Signed)
[facility] DEPT Provider Note   CSN: [id number] Arrival date & time: 11/16/15  2345  First Provider Contact:  1:41 AM   By signing my name below, I, Lauren Johns, attest that this documentation has been prepared under the direction and in the presence of Briel Gallicchio PA-C.  Electronically Signed: Ephriam Johns, ED Scribe. 11/17/15. 1:41 AM.   History   Chief Complaint Chief Complaint  Patient presents with  . Sore Throat    HPI HPI Comments: Lauren Johns is a 45 y.o. female who presents to the Emergency Department complaining of gradually worsening neck pain and sore throat; onset five days ago. Pt states she went to her chiropractor to get adjusted in attempt to relieve her neck pain. Pt reports no relief from the spinal adjustment and states that this normally helps any similar pain. Pt reports that she also started having a sore throat shortly after the onset of her neck pain. Pt states "it feels like glass when swallowing". Pt was then seen at an Urgent Care three days ago where she had a neck xray and was prescribed a muscle relaxer, ibuprofen. Pt's strep was negative during visit to Urgent Care. Pt states she has taken these medications with no relief of symptoms. Pt further reports mild cough that started tonight. Pt denies any fever, chills, nausea, vomiting. Pt further denies any known sick contact. Pt has appointment with PCP in five days.    The history is provided by the patient. No language interpreter was used.    Past Medical History:  Diagnosis Date  . Abnormal brain MRI   . Allergic rhinitis 05/21/2013  . Arthritis   . Asthma   . Chronic kidney disease   . Depression    h/o   . Diabetes ([place]) 04/03/2013  . Elevated lipids   . GERD (gastroesophageal reflux disease)   . Infertility, female    h/o  . Insomnia   . Obesity   . PFO (patent foramen ovale)    ??PFO repair:  in the 90s, was seen several times after and released (per patient)    Patient Active  Problem List   Diagnosis Date Noted  . Internal hemorrhoids 05/14/2015  . PCP notes ------------ 12/25/2014  . Abnormal brain MRI-- pituitary adenoma  03/20/2014  . Elevated lipids 03/20/2014  . Allergic rhinitis 05/21/2013  . Headache, migraine 05/21/2013  . Diabetes ([place]) 04/03/2013  . Dyspnea on exertion 06/29/2012  . Breast pain, left 06/09/2012  . PFO (patent foramen ovale) 10/11/2011  . Acne 10/11/2011  . General medical examination 10/26/2010  . HEADACHE 02/14/2009  . GERD 09/11/2008  . DEPRESSION 04/20/2007  . INSOMNIA-SLEEP DISORDER-UNSPEC 04/19/2007  . OBESITY 12/13/2006  . Asthma, chronic 12/13/2006    Past Surgical History:  Procedure Laterality Date  . closed hearty valve repair  [place] , PFO repair??    OB History    No data available       Home Medications    Prior to Admission medications   Medication Sig Start Date End Date Taking? Authorizing Provider  cyclobenzaprine (FLEXERIL) 5 MG tablet Take 5-10 mg by mouth 3 (three) times daily as needed for muscle spasms.  11/14/15  Yes Historical Provider, MD  diclofenac (VOLTAREN) 75 MG EC tablet Take one pill BID with food Patient taking differently: Take 75 mg by mouth 2 (two) times daily as needed (for foot pain.). Take one pill BID with food 09/24/15  Yes Thurman Coyer, DO  ibuprofen (ADVIL,MOTRIN) 800 MG tablet Take 800 mg by mouth 3 (three) times daily as needed (for pain.).  11/14/15  Yes Historical Provider, MD  LO LOESTRIN FE 1 MG-10 MCG / 10 MCG tablet Take 1 tablet by mouth daily. 10/31/15  Yes Historical Provider, MD  Multiple Vitamin (MULTIVITAMIN WITH MINERALS) TABS tablet Take 1 tablet by mouth daily. ALIVE   Yes Historical Provider, MD  albuterol (VENTOLIN HFA) 108 (90 Base) MCG/ACT inhaler Inhale 1 puff into the lungs every 6 (six) hours as needed for wheezing or shortness of breath. 07/21/15   Colon Branch, MD  mometasone-formoterol [facility]) 100-5 MCG/ACT AERO Inhale 2 puffs into the  lungs 2 (two) times daily. Patient not taking: Reported on 11/17/2015 07/14/15   Colon Branch, MD  Naltrexone-Bupropion HCl ER (CONTRAVE) 8-90 MG TB12 1 tablet a day for one week, then one tablet twice a day Patient not taking: Reported on 11/17/2015 08/28/15   Colon Branch, MD    Family History Family History  Problem Relation Age of Onset  . Heart attack Father 46  . Diabetes Mother   . Hypertension Mother     M, F  . Prostate cancer Maternal Grandfather   . Cancer Maternal Grandfather     late in life   . Breast cancer Neg Hx     Social History Social History  Substance Use Topics  . Smoking status: Never Smoker  . Smokeless tobacco: Never Used  . Alcohol use Yes     Comment: socially      Allergies   Metformin and related; Oxycodone-acetaminophen; and Penicillins   Review of Systems Review of Systems  Constitutional: Negative for fever.  HENT: Positive for ear pain (bilateral ear ache) and sore throat.   Respiratory: Positive for cough (mild).   Gastrointestinal: Negative for nausea and vomiting.  Musculoskeletal: Positive for neck pain.  All other systems reviewed and are negative.    Physical Exam Updated Vital Signs BP 148/56 (BP Location: Left Arm)   Pulse 66   Temp 98.7 F (37.1 C) (Oral)   Resp 20   SpO2 100%   Physical Exam  Constitutional: She is oriented to person, place, and time. She appears well-developed and well-nourished. No distress.  HENT:  Head: Normocephalic and atraumatic.  Mouth/Throat: No oropharyngeal exudate.  Eyes: Conjunctivae are normal.  Neck: Normal range of motion.  Cardiovascular: Normal rate.   No murmur heard. Pulmonary/Chest: Effort normal. She has no wheezes. She has no rales.  Abdominal: Soft. There is no tenderness.  Musculoskeletal: Normal range of motion.  Lymphadenopathy:    She has no cervical adenopathy.  Neurological: She is alert and oriented to person, place, and time.  Skin: Skin is warm and dry. She is not  diaphoretic.  Psychiatric: She has a normal mood and affect. Judgment normal.  Nursing note and vitals reviewed.    ED Treatments / Results  DIAGNOSTIC STUDIES: Oxygen Saturation is 100% on RA, normal by my interpretation.  COORDINATION OF CARE: 1:45 AM-Will order continued care instructions. Discussed treatment plan with pt at bedside and pt agreed to plan.   Labs (all labs ordered are listed, but only abnormal results are displayed) Labs Reviewed - No data to display  EKG  EKG Interpretation None       Radiology No results found.  Procedures Procedures (including critical care time)  Medications Ordered in ED Medications - No data to display   Initial Impression / Assessment and Plan / ED Course  I have reviewed the triage vital signs and the nursing notes.  Pertinent labs & imaging results that were available during my care of the patient were reviewed by me and considered in my medical decision making (see chart for details).  Clinical Course    Patient presents with sore throat for 5-6 days, no improvement. She is afebrile. She has URI symptoms of congestion. Complains of some soreness to cervical area which is incidental to pharyngeal pain.  Will treat with Z-pack given duration of symptoms. Encourage PCP follow up this week as scheduled.   Final Clinical Impressions(s) / ED Diagnoses   Final diagnoses:  None   1. Pharyngitis  New Prescriptions New Prescriptions   No medications on file  I personally performed the services described in this documentation, which was scribed in my presence. The recorded information has been reviewed and is accurate.     Charlann Lange, PA-C 11/17/15 0206    Everlene Balls, MD 11/17/15 [phone]

## 2015-11-21 ENCOUNTER — Encounter: Payer: Self-pay | Admitting: Internal Medicine

## 2015-11-21 ENCOUNTER — Ambulatory Visit (INDEPENDENT_AMBULATORY_CARE_PROVIDER_SITE_OTHER): Payer: BC Managed Care – PPO | Admitting: Internal Medicine

## 2015-11-21 VITALS — BP 122/64 | HR 67 | Temp 98.7°F | Resp 14 | Ht 65.0 in | Wt 309.0 lb

## 2015-11-21 DIAGNOSIS — J4520 Mild intermittent asthma, uncomplicated: Secondary | ICD-10-CM | POA: Diagnosis not present

## 2015-11-21 DIAGNOSIS — E785 Hyperlipidemia, unspecified: Secondary | ICD-10-CM | POA: Diagnosis not present

## 2015-11-21 MED ORDER — MOMETASONE FURO-FORMOTEROL FUM 100-5 MCG/ACT IN AERO: 2.0000 | INHALATION_SPRAY | Freq: Two times a day (BID) | RESPIRATORY_TRACT | 6 refills | Status: DC

## 2015-11-21 NOTE — Progress Notes (Signed)
Subjective:    Patient ID: Lauren Johns, female    DOB: [birth year], 45 y.o.   MRN: [id number]  DOS:  11/21/2015 Type of visit - description : Routine checkup Interval history: Went to the ER with sore throat, there finishing a Z-Pak today, symptoms are better. Asthma was mildly exacerbated by the respiratory infection but is also better. Med list is reviewed, good compliance except for contrave. After 3 or 4 weeks of taking contrave she developed back pain, lethargy, hot flashes. It did help her loosing some weight. Since then his weight has rebound and she is now 309 pounds.  Wt Readings from Last 3 Encounters:  11/21/15 (!) 309 lb (140.2 kg)  10/28/15 299 lb (135.6 kg)  09/24/15 299 lb (135.6 kg)     Review of Systems No fever or chills  Past Medical History:  Diagnosis Date  . Abnormal brain MRI   . Allergic rhinitis 05/21/2013  . Arthritis   . Asthma   . Chronic kidney disease   . Depression    h/o   . Diabetes ([place]) 04/03/2013  . Elevated lipids   . GERD (gastroesophageal reflux disease)   . Infertility, female    h/o  . Insomnia   . Obesity   . PFO (patent foramen ovale)    ??PFO repair:  in the 90s, was seen several times after and released (per patient)    Past Surgical History:  Procedure Laterality Date  . closed hearty valve repair  [place] , PFO repair??    Social History   Social History  . Marital status: Married    Spouse name: N/A  . Number of children: 0  . Years of education: N/A   Occupational History  . school teacher     Social History Main Topics  . Smoking status: Never Smoker  . Smokeless tobacco: Never Used  . Alcohol use Yes     Comment: socially   . Drug use: No  . Sexual activity: Not on file   Other Topics Concern  . Not on file   Social History Narrative   mother is Elodia Florence, one of my patients    Lives w/ husband        Medication List       Accurate as of 11/21/15 11:59 PM. Always use your  most recent med list.          albuterol 108 (90 Base) MCG/ACT inhaler Commonly known as:  VENTOLIN HFA Inhale 1 puff into the lungs every 6 (six) hours as needed for wheezing or shortness of breath.   cyclobenzaprine 5 MG tablet Commonly known as:  FLEXERIL Take 5-10 mg by mouth 3 (three) times daily as needed for muscle spasms.   diclofenac 75 MG EC tablet Commonly known as:  VOLTAREN Take one pill BID with food   ibuprofen 800 MG tablet Commonly known as:  ADVIL,MOTRIN Take 800 mg by mouth 3 (three) times daily as needed (for pain.).   LO LOESTRIN FE 1 MG-10 MCG / 10 MCG tablet Generic drug:  Norethindrone-Ethinyl Estradiol-Fe Biphas Take 1 tablet by mouth daily.   mometasone-formoterol 100-5 MCG/ACT Aero Commonly known as:  DULERA Inhale 2 puffs into the lungs 2 (two) times daily.   multivitamin with minerals Tabs tablet Take 1 tablet by mouth daily. ALIVE          Objective:   Physical Exam BP 122/64 (BP Location: Left Arm, Patient Position: Sitting, Cuff Size: Normal)  Pulse 67   Temp 98.7 F (37.1 C) (Oral)   Resp 14   Ht 5\' 5"  (1.651 m)   Wt (!) 309 lb (140.2 kg)   SpO2 96%   BMI 51.42 kg/m  General:   Well developed, morbidly obese . NAD.  HEENT:  Normocephalic . Face symmetric, atraumatic Lungs:  CTA B Normal respiratory effort, no intercostal retractions, no accessory muscle use. Heart: RRR,  no murmur.  No pretibial edema bilaterally  Skin: Not pale. Not jaundice Neurologic:  alert & oriented X3.  Speech normal, gait appropriate for age and unassisted Psych--  Cognition and judgment appear intact.  Cooperative with normal attention span and concentration.  Behavior appropriate. No anxious or depressed appearing.      Assessment & Plan:    Assessment >> DM aic 6.6 (02-2014) Hyperlipidemia Asthma Insomnia GERD Morbid obesity  Weight: 2008 281 pounds, 2009 275 pounds,  , 10/22/2011 293  Int hemorrhoids dx 05-2015  H/o abnormal  brain MRI-- pituitary adenoma used to see Dr Trenton Gammon H/o  depression Birth control --> H/o infertility, on BCP as of 11-2015 H/o close heart surgery at [place], PFO repair?Echo 10-2011: wnl  PLAN: Hyperlipidemia: Last LDL 143, ideal close to 100. Work on diet for now. Asthma: Slightly exacerbated by recent infection, refill Dulera today. Call if not gradually improving Morbid obesity, tried Contrave, did help w/weight loss  but caused a # of s/e. We again discuss the need to eat healthy and exercise. Encouraged to do her own research about bariatric surgery, see AVS. RTC 3-4 months, CPX.

## 2015-11-21 NOTE — Progress Notes (Signed)
Pre visit review using our clinic review tool, if applicable. No additional management support is needed unless otherwise documented below in the visit note. 

## 2015-11-21 NOTE — Patient Instructions (Signed)
  GO TO THE FRONT DESK Schedule your next appointment for a  Physical in 3-4 months , fasting  Check the [facility] web site for info about bariatric surgery

## 2015-11-23 NOTE — Assessment & Plan Note (Signed)
Hyperlipidemia: Last LDL 143, ideal close to 100. Work on diet for now. Asthma: Slightly exacerbated by recent infection, refill Dulera today. Call if not gradually improving Morbid obesity, tried Contrave, did help w/weight loss  but caused a # of s/e. We again discuss the need to eat healthy and exercise. Encouraged to do her own research about bariatric surgery, see AVS. RTC 3-4 months, CPX.

## 2016-01-27 ENCOUNTER — Ambulatory Visit (INDEPENDENT_AMBULATORY_CARE_PROVIDER_SITE_OTHER): Payer: BC Managed Care – PPO | Admitting: Internal Medicine

## 2016-01-27 ENCOUNTER — Encounter: Payer: Self-pay | Admitting: Internal Medicine

## 2016-01-27 VITALS — BP 122/68 | HR 69 | Temp 98.2°F | Resp 14 | Ht 65.0 in | Wt 305.2 lb

## 2016-01-27 DIAGNOSIS — M79661 Pain in right lower leg: Secondary | ICD-10-CM | POA: Diagnosis not present

## 2016-01-27 DIAGNOSIS — J069 Acute upper respiratory infection, unspecified: Secondary | ICD-10-CM

## 2016-01-27 MED ORDER — AZITHROMYCIN 250 MG PO TABS: ORAL_TABLET | ORAL | 0 refills | Status: DC

## 2016-01-27 NOTE — Progress Notes (Signed)
Pre visit review using our clinic review tool, if applicable. No additional management support is needed unless otherwise documented below in the visit note. 

## 2016-01-27 NOTE — Patient Instructions (Signed)
Rest, fluids , tylenol  For cough:  Take Mucinex DM twice a day as needed until better  For nasal congestion: Use OTC Nasocort or Flonase : 2 nasal sprays on each side of the nose in the morning until you feel better   Avoid decongestants such as  Pseudoephedrine or phenylephrine    Take the antibiotic as prescribed  (zithromax) ONLY if no better in 4-5 days   Call if not gradually better over the next  10 days  Call anytime if the symptoms are severe

## 2016-01-27 NOTE — Progress Notes (Signed)
Subjective:    Patient ID: Lauren Johns, female    DOB: [birth year], 45 y.o.   MRN: [id number]  DOS:  01/27/2016 Type of visit - description : acute Interval history: Sx started ~ 4 days ago: Sneezing, ear ache, mild cough, clear yellowish sputum. Taking OTC Mucinex and Advil.  Also, One-week history of right calf pain described as on and off cram, worse when she moves. Denies any injury or or doing.   Review of Systems No fever chills, some sore throat. No nausea, vomiting, diarrhea Denies chest pain, difficulty breathing No recent eir plane trip or prolonged car trip  Past Medical History:  Diagnosis Date  . Abnormal brain MRI   . Allergic rhinitis 05/21/2013  . Arthritis   . Asthma   . Chronic kidney disease   . Depression    h/o   . Diabetes ([place]) 04/03/2013  . Elevated lipids   . GERD (gastroesophageal reflux disease)   . Infertility, female    h/o  . Insomnia   . Obesity   . PFO (patent foramen ovale)    ??PFO repair:  in the 90s, was seen several times after and released (per patient)    Past Surgical History:  Procedure Laterality Date  . closed hearty valve repair  [place] , PFO repair??    Social History   Social History  . Marital status: Married    Spouse name: N/A  . Number of children: 0  . Years of education: N/A   Occupational History  . school teacher     Social History Main Topics  . Smoking status: Never Smoker  . Smokeless tobacco: Never Used  . Alcohol use Yes     Comment: socially   . Drug use: No  . Sexual activity: Not on file   Other Topics Concern  . Not on file   Social History Narrative   mother is Lauren Johns, one of my patients    Lives w/ husband        Medication List       Accurate as of 01/27/16 11:59 PM. Always use your most recent med list.          albuterol 108 (90 Base) MCG/ACT inhaler Commonly known as:  VENTOLIN HFA Inhale 1 puff into the lungs every 6 (six) hours as needed for  wheezing or shortness of breath.   azithromycin 250 MG tablet Commonly known as:  ZITHROMAX Z-PAK 2 tabs a day the first day, then 1 tab a day x 4 days   cyclobenzaprine 5 MG tablet Commonly known as:  FLEXERIL Take 5-10 mg by mouth 3 (three) times daily as needed for muscle spasms.   diclofenac 75 MG EC tablet Commonly known as:  VOLTAREN Take one pill BID with food   ibuprofen 800 MG tablet Commonly known as:  ADVIL,MOTRIN Take 800 mg by mouth 3 (three) times daily as needed (for pain.).   LO LOESTRIN FE 1 MG-10 MCG / 10 MCG tablet Generic drug:  Norethindrone-Ethinyl Estradiol-Fe Biphas Take 1 tablet by mouth daily.   mometasone-formoterol 100-5 MCG/ACT Aero Commonly known as:  DULERA Inhale 2 puffs into the lungs 2 (two) times daily.   multivitamin with minerals Tabs tablet Take 1 tablet by mouth daily. ALIVE          Objective:   Physical Exam  Musculoskeletal:       Legs:  BP 122/68 (BP Location: Left Arm, Patient Position: Sitting, Cuff Size:  Normal)   Pulse 69   Temp 98.2 F (36.8 C) (Oral)   Resp 14   Ht 5\' 5"  (1.651 m)   Wt (!) 305 lb 4 oz (138.5 kg)   SpO2 96%   BMI 50.80 kg/m  General:   Well developed, well nourished . NAD.  HEENT:  Normocephalic . Face symmetric, atraumatic. TMs normal. Throat symmetric, not red. Nose is slightly congested, sinuses no TTP Lungs:  CTA B Normal respiratory effort, no intercostal retractions, no accessory muscle use. Heart: RRR,  no murmur.  Lower extremities: No pitting edema Calves symmetric in size Posterior right calf: Slightly tender, see graphic Left calf: Not tender but in the middle of the calf she has minimally  palpable veins. Skin: Not pale. Not jaundice Neurologic:  alert & oriented X3.  Speech normal, gait appropriate for age and unassisted Psych--  Cognition and judgment appear intact.  Cooperative with normal attention span and concentration.  Behavior appropriate. No anxious or depressed  appearing.      Assessment & Plan:   Assessment   DM aic 6.6 (02-2014) Hyperlipidemia Asthma Insomnia GERD Morbid obesity  Weight: 2008 281 pounds, 2009 275 pounds,  , 10/22/2011 293  Int hemorrhoids dx 05-2015  H/o abnormal brain MRI-- pituitary adenoma used to see Dr Trenton Gammon H/o  depression Birth control --> H/o infertility, on BCP as of 11-2015 H/o close heart surgery at [place], PFO repair?Echo 10-2011: wnl  PLAN: URI: Recommend conservative treatment first, if it fails to start a Z-Pak. Asthma does not seem to be exacerbated Pain, right calf: Posterior calf is slightly tender, she has prominent veins,  not red or hot. Superficial phlebitis? DVT? Will get a ultrasound. Further advice which results.

## 2016-01-28 ENCOUNTER — Telehealth: Payer: Self-pay | Admitting: Internal Medicine

## 2016-01-28 ENCOUNTER — Ambulatory Visit (HOSPITAL_BASED_OUTPATIENT_CLINIC_OR_DEPARTMENT_OTHER)
Admission: RE | Admit: 2016-01-28 | Discharge: 2016-01-28 | Disposition: A | Discharge status: Home / Self Care | Payer: BC Managed Care – PPO | Source: Ambulatory Visit | Attending: Internal Medicine | Admitting: Internal Medicine

## 2016-01-28 DIAGNOSIS — M79661 Pain in right lower leg: Secondary | ICD-10-CM | POA: Insufficient documentation

## 2016-01-28 MED ORDER — FLUTICASONE PROPIONATE 50 MCG/ACT NA SUSP: 2.0000 | Freq: Every day | NASAL | 6 refills | Status: DC | PRN

## 2016-01-28 MED ORDER — HYDROCODONE-HOMATROPINE 5-1.5 MG/5ML PO SYRP: 5.0000 mL | ORAL_SOLUTION | Freq: Every evening | ORAL | 0 refills | Status: DC | PRN

## 2016-01-28 NOTE — Telephone Encounter (Signed)
Advise patient, I am prescribing hydrocodone to be taken at night only,  it may cause nausea and vomiting just as oxycodone did. If she has problems let me know. Will cause drowsiness

## 2016-01-28 NOTE — Telephone Encounter (Signed)
Spoke w/ Pt, informed her that Rx for Hycodan has been placed at front desk for pick up and Flonase sent directly to pharmacy. Informed Pt that Rx can cause drowsiness and N/V. Pt verbalized understanding.

## 2016-01-28 NOTE — Assessment & Plan Note (Signed)
URI: Recommend conservative treatment first, if it fails to start a Z-Pak. Asthma does not seem to be exacerbated Pain, right calf: Posterior calf is slightly tender, she has prominent veins,  not red or hot. Superficial phlebitis? DVT? Will get a ultrasound. Further advice which results.

## 2016-01-28 NOTE — Telephone Encounter (Signed)
Caller name: Relationship to patient: Self Can be reached: [phone] Pharmacy:  [id number], [place] [phone] (Phone) [phone] (Fax)     Reason for call: Request to have a cough syrup called in for her. States she was seen yesterday but was not given a cough medication and the coughing is keeping her awake at night.  Alos needs a Rx for a nssal spray

## 2016-01-28 NOTE — Telephone Encounter (Signed)
Please advise 

## 2016-03-02 ENCOUNTER — Telehealth: Payer: Self-pay | Admitting: Internal Medicine

## 2016-03-02 NOTE — Telephone Encounter (Signed)
Spoke w/ Pt, informed her of recommendations. She has been using her "breathing treatments" and using OTC Mucinex DM, cough okay during day but needing something to help sleep at night, she also admits to having bilateral ear pain. Instructed I would recommend seeing PCP before holiday as office will be closed Thursday and Friday. Pt scheduled acute visit for 03/03/2016 at 0930.

## 2016-03-02 NOTE — Telephone Encounter (Signed)
Self. Pt says that she has a cough and would like to know if provider could call her in HYDROcodone-homatropine. She says that it helped before     Pharmacy: [id number], [place]

## 2016-03-02 NOTE — Telephone Encounter (Signed)
For cough recommend OTC Mucinex DM first. Hydrocodone is very strong medication, if she is coughing that severely, needs to be seen

## 2016-03-02 NOTE — Telephone Encounter (Signed)
Please advise 

## 2016-03-03 ENCOUNTER — Ambulatory Visit (INDEPENDENT_AMBULATORY_CARE_PROVIDER_SITE_OTHER): Payer: BC Managed Care – PPO | Admitting: Internal Medicine

## 2016-03-03 ENCOUNTER — Encounter: Payer: Self-pay | Admitting: Internal Medicine

## 2016-03-03 VITALS — BP 118/68 | HR 67 | Temp 98.2°F | Resp 14 | Ht 65.0 in | Wt 301.2 lb

## 2016-03-03 DIAGNOSIS — J45901 Unspecified asthma with (acute) exacerbation: Secondary | ICD-10-CM | POA: Diagnosis not present

## 2016-03-03 MED ORDER — AZITHROMYCIN 250 MG PO TABS: ORAL_TABLET | ORAL | 0 refills | Status: DC

## 2016-03-03 MED ORDER — PREDNISONE 10 MG PO TABS: ORAL_TABLET | ORAL | 0 refills | Status: DC

## 2016-03-03 MED ORDER — AZELASTINE HCL 0.1 % NA SOLN: 2.0000 | Freq: Every evening | NASAL | 3 refills | Status: DC | PRN

## 2016-03-03 MED ORDER — HYDROCODONE-HOMATROPINE 5-1.5 MG/5ML PO SYRP: 5.0000 mL | ORAL_SOLUTION | Freq: Every evening | ORAL | 0 refills | Status: DC | PRN

## 2016-03-03 NOTE — Patient Instructions (Signed)
Rest, fluids , tylenol  For cough:  Continue with Mucinex DM twice a day as needed until better  For nasal congestion: Use OTC  Flonase : 2 nasal sprays on each side of the nose in the morning until you feel better Use ASTELIN a prescribed spray : 2 nasal sprays on each side of the nose at night until you feel better   Avoid decongestants such as  Pseudoephedrine or phenylephrine   Take prednisone as prescribed for few days. It may increase your blood sugar, check your blood sugar once daily, stop prednisone and call us if your blood sugar is more than 200.  Continue using Dulera twice a day and albuterol up to every 4 hours for cough and wheezing   Take the antibiotic as prescribed  (Zithromax)  Call if not gradually better over the next  10 days  Call anytime if the symptoms are severe

## 2016-03-03 NOTE — Progress Notes (Signed)
Subjective:    Patient ID: Lauren Johns, female    DOB: [birth year], 45 y.o.   MRN: [id number]  DOS:  03/03/2016 Type of visit - description : Acute Interval history: Symptoms started about 3 days ago with cough, ear ache, postnasal dripping which triggers more cough . She also noticed wheezing and sputum production, small amounts, greenish. Taking Mucinex DM with only partial relief.    Review of Systems Denies fever chills. No chest pain or actual difficulty breathing. No hemoptysis. Had some nausea only with cough spells. No vomiting  Past Medical History:  Diagnosis Date  . Abnormal brain MRI   . Allergic rhinitis 05/21/2013  . Arthritis   . Asthma   . Chronic kidney disease   . Depression    h/o   . Diabetes ([place]) 04/03/2013  . Elevated lipids   . GERD (gastroesophageal reflux disease)   . Infertility, female    h/o  . Insomnia   . Obesity   . PFO (patent foramen ovale)    ??PFO repair:  in the 90s, was seen several times after and released (per patient)    Past Surgical History:  Procedure Laterality Date  . closed hearty valve repair  [place] , PFO repair??    Social History   Social History  . Marital status: Married    Spouse name: N/A  . Number of children: 0  . Years of education: N/A   Occupational History  . school teacher     Social History Main Topics  . Smoking status: Never Smoker  . Smokeless tobacco: Never Used  . Alcohol use Yes     Comment: socially   . Drug use: No  . Sexual activity: Not on file   Other Topics Concern  . Not on file   Social History Narrative   mother is Elodia Florence, one of my patients    Lives w/ husband        Medication List       Accurate as of 03/03/16  6:17 PM. Always use your most recent med list.          albuterol 108 (90 Base) MCG/ACT inhaler Commonly known as:  VENTOLIN HFA Inhale 1 puff into the lungs every 6 (six) hours as needed for wheezing or shortness of breath.     azelastine 0.1 % nasal spray Commonly known as:  ASTELIN Place 2 sprays into both nostrils at bedtime as needed for rhinitis. Use in each nostril as directed   azithromycin 250 MG tablet Commonly known as:  ZITHROMAX Z-PAK 2 tabs a day the first day, then 1 tab a day x 4 days   cyclobenzaprine 5 MG tablet Commonly known as:  FLEXERIL Take 5-10 mg by mouth 3 (three) times daily as needed for muscle spasms.   fluticasone 50 MCG/ACT nasal spray Commonly known as:  FLONASE Place 2 sprays into both nostrils daily as needed for allergies or rhinitis.   HYDROcodone-homatropine 5-1.5 MG/5ML syrup Commonly known as:  HYCODAN Take 5 mLs by mouth at bedtime as needed for cough.   ibuprofen 800 MG tablet Commonly known as:  ADVIL,MOTRIN Take 800 mg by mouth 3 (three) times daily as needed (for pain.).   LO LOESTRIN FE 1 MG-10 MCG / 10 MCG tablet Generic drug:  Norethindrone-Ethinyl Estradiol-Fe Biphas Take 1 tablet by mouth daily.   mometasone-formoterol 100-5 MCG/ACT Aero Commonly known as:  DULERA Inhale 2 puffs into the lungs 2 (two) times  daily.   multivitamin with minerals Tabs tablet Take 1 tablet by mouth daily. ALIVE   predniSONE 10 MG tablet Commonly known as:  DELTASONE 3 tabs x 3 days, 2 tabs x 3 days, 1 tab x 3 days          Objective:   Physical Exam BP 118/68 (BP Location: Left Arm, Patient Position: Sitting, Cuff Size: Normal)   Pulse 67   Temp 98.2 F (36.8 C) (Oral)   Resp 14   Ht 5\' 5"  (1.651 m)   Wt (!) 301 lb 4 oz (136.6 kg)   SpO2 96%   BMI 50.13 kg/m  General:   Well developed, well nourished . NAD.  HEENT:  Normocephalic . Face symmetric, atraumatic. TMs bulge bilaterally, not read. Nose congested, sinuses no TTP. Throat symmetric and not red Lungs:  Mild  wheezing, few rhonchi. Normal respiratory effort, no intercostal retractions, no accessory muscle use. Heart: RRR,  no murmur.  No pretibial edema bilaterally  Skin: Not pale. Not  jaundice Neurologic:  alert & oriented X3.  Speech normal, gait appropriate for age and unassisted Psych--  Cognition and judgment appear intact.  Cooperative with normal attention span and concentration.  Behavior appropriate. No anxious or depressed appearing.      Assessment & Plan:   Assessment   DM aic 6.6 (02-2014) Hyperlipidemia Asthma Insomnia GERD Morbid obesity  Weight: 2008 281 pounds, 2009 275 pounds,  , 10/22/2011 293  Int hemorrhoids dx 05-2015  H/o abnormal brain MRI-- pituitary adenoma used to see Dr Trenton Gammon H/o  depression Birth control --> H/o infertility, on BCP as of 11-2015 H/o close heart surgery at [place], PFO repair?Echo 10-2011: wnl  PLAN: Asthma with exacerbation likely due to bronchitis. Continue Dulera,  albuterol. Prescribed prednisone, monitor CBGs. Also will use a Z-Pak (was seen last month with URI, she never use antibiotics for that particular episode). RF hydrocodone. See AVS

## 2016-03-03 NOTE — Progress Notes (Signed)
Pre visit review using our clinic review tool, if applicable. No additional management support is needed unless otherwise documented below in the visit note. 

## 2016-03-03 NOTE — Assessment & Plan Note (Signed)
Asthma with exacerbation likely due to bronchitis. Continue Dulera,  albuterol. Prescribed prednisone, monitor CBGs. Also will use a Z-Pak (was seen last month with URI, she never use antibiotics for that particular episode). RF hydrocodone. See AVS

## 2016-03-12 ENCOUNTER — Telehealth: Payer: Self-pay | Admitting: Internal Medicine

## 2016-03-12 MED ORDER — ALBUTEROL SULFATE HFA 108 (90 BASE) MCG/ACT IN AERS: 1.0000 | INHALATION_SPRAY | Freq: Four times a day (QID) | RESPIRATORY_TRACT | 5 refills | Status: DC | PRN

## 2016-03-12 NOTE — Telephone Encounter (Signed)
Rx sent 

## 2016-03-12 NOTE — Telephone Encounter (Signed)
Relation to [id number] Call back number:[phone] Pharmacy: [facility], [place] [address] [phone] (Phone) [phone] (Fax)     Reason for call:  Patient requesting refill albuterol (VENTOLIN HFA) 108 (90 Base) MCG/ACT inhaler solution only, please advise

## 2016-03-15 ENCOUNTER — Ambulatory Visit (HOSPITAL_BASED_OUTPATIENT_CLINIC_OR_DEPARTMENT_OTHER)
Admission: RE | Admit: 2016-03-15 | Discharge: 2016-03-15 | Disposition: A | Discharge status: Home / Self Care | Payer: BC Managed Care – PPO | Source: Ambulatory Visit | Attending: Medical | Admitting: Medical

## 2016-03-15 ENCOUNTER — Ambulatory Visit (INDEPENDENT_AMBULATORY_CARE_PROVIDER_SITE_OTHER): Payer: BC Managed Care – PPO | Admitting: Medical

## 2016-03-15 ENCOUNTER — Encounter: Payer: Self-pay | Admitting: Medical

## 2016-03-15 VITALS — BP 116/76 | HR 75 | Temp 98.3°F | Ht 65.0 in | Wt 300.0 lb

## 2016-03-15 DIAGNOSIS — R059 Cough, unspecified: Secondary | ICD-10-CM

## 2016-03-15 DIAGNOSIS — R062 Wheezing: Secondary | ICD-10-CM | POA: Diagnosis present

## 2016-03-15 DIAGNOSIS — J209 Acute bronchitis, unspecified: Secondary | ICD-10-CM

## 2016-03-15 DIAGNOSIS — R05 Cough: Secondary | ICD-10-CM | POA: Insufficient documentation

## 2016-03-15 LAB — CBC WITH DIFFERENTIAL/PLATELET
BASOS PCT: 0 %
Basophils Absolute: 0 cells/uL (ref 0–200)
EOS PCT: 1 %
Eosinophils Absolute: 80 cells/uL (ref 15–500)
HCT: 36.5 % (ref 35.0–45.0)
Hemoglobin: 11.7 g/dL (ref 11.7–15.5)
LYMPHS PCT: 32 %
Lymphs Abs: 2560 cells/uL (ref 850–3900)
MCH: 29.1 pg (ref 27.0–33.0)
MCHC: 32.1 g/dL (ref 32.0–36.0)
MCV: 90.8 fL (ref 80.0–100.0)
MONOS PCT: 7 %
MPV: 9.7 fL (ref 7.5–12.5)
Monocytes Absolute: 560 cells/uL (ref 200–950)
NEUTROS ABS: 4800 {cells}/uL (ref 1500–7800)
Neutrophils Relative %: 60 %
PLATELETS: 344 10*3/uL (ref 140–400)
RBC: 4.02 MIL/uL (ref 3.80–5.10)
RDW: 14.3 % (ref 11.0–15.0)
WBC: 8 10*3/uL (ref 3.8–10.8)

## 2016-03-15 MED ORDER — FLUTICASONE PROPIONATE 50 MCG/ACT NA SUSP: 2.0000 | Freq: Every day | NASAL | 1 refills | Status: DC

## 2016-03-15 MED ORDER — DOXYCYCLINE HYCLATE 100 MG PO TABS: 100.0000 mg | ORAL_TABLET | Freq: Two times a day (BID) | ORAL | 0 refills | Status: DC

## 2016-03-15 MED ORDER — PREDNISONE 10 MG PO TABS: ORAL_TABLET | ORAL | 0 refills | Status: DC

## 2016-03-15 MED ORDER — HYDROCODONE-HOMATROPINE 5-1.5 MG/5ML PO SYRP: 5.0000 mL | ORAL_SOLUTION | Freq: Three times a day (TID) | ORAL | 0 refills | Status: DC | PRN

## 2016-03-15 MED ORDER — CLARITHROMYCIN ER 500 MG PO TB24: 1000.0000 mg | ORAL_TABLET | Freq: Every day | ORAL | 0 refills | Status: DC

## 2016-03-15 NOTE — Patient Instructions (Addendum)
You appear to have bronchitis. Rest hydrate and tylenol for fever. I am prescribing cough medicine hycodan  and doxycycline antibiotic. For your nasal congestion you could use flonase(rx given)  For wheezing taper prednisone. Continue your 2 inhalers. Watch your sugar intake. Check sugars if sugars exceed 200 while on prednisone stop prednisone and notify us.  Get cxr and cbc today.  Follow up in 7-10 days or as needed

## 2016-03-15 NOTE — Progress Notes (Signed)
Subjective:    Patient ID: Lauren Johns, female    DOB: [birth year], 45 y.o.   MRN: [id number]  HPI  Pt in stating sick 3 wks feeling sick. She states around thanksgiving got hacky cough. Then she states wednesday before thanskkgiving got antibiotic, prednisone and breathing treatment. Pt states she given cough medicine which she is almost out of. Pt states she thought she was getting better briefly but never 100% better. Then got sick again and feel like same as before.  Pt is on dulera and albuterol. Pt states she is wheezing a lot recently last 3-4 days.   Dr. Larose Kells gave z-pack, hydrocodone refill.  Pt given brief rx of prednisone. With instruction to stop if sugars go over 200. Was a 9 days taper course. Pt a1c 6.4 in the past. Pt is not on any diabetic meds. Pt mentioned that when she was on tapred steroid her sugars were 98-103.   Pt is on birth control tablets. Also tod infertile with pituitary disorder. Hx of irregular cycles and she see her gyn.     Review of Systems  Constitutional: Negative for chills, fatigue and fever.  HENT: Negative for congestion, hearing loss, postnasal drip, rhinorrhea, sinus pain, sinus pressure and sore throat.   Respiratory: Positive for cough and wheezing. Negative for chest tightness and shortness of breath.        Chest congestion.  Cardiovascular: Negative for chest pain and palpitations.  Gastrointestinal: Negative for abdominal pain.  Musculoskeletal: Negative for back pain, gait problem, myalgias, neck pain and neck stiffness.       Brief transient chest pain when she cough or takes deep breath. Last for about 1-2 seconds then subsides.  Skin: Negative for rash.  Hematological: Negative for adenopathy. Does not bruise/bleed easily.  Psychiatric/Behavioral: Negative for behavioral problems and confusion.     Past Medical History:  Diagnosis Date  . Abnormal brain MRI   . Allergic rhinitis 05/21/2013  . Arthritis   . Asthma   . Chronic  kidney disease   . Depression    h/o   . Diabetes ([place]) 04/03/2013  . Elevated lipids   . GERD (gastroesophageal reflux disease)   . Infertility, female    h/o  . Insomnia   . Obesity   . PFO (patent foramen ovale)    ??PFO repair:  in the 90s, was seen several times after and released (per patient)     Social History   Social History  . Marital status: Married    Spouse name: N/A  . Number of children: 0  . Years of education: N/A   Occupational History  . school teacher     Social History Main Topics  . Smoking status: Never Smoker  . Smokeless tobacco: Never Used  . Alcohol use Yes     Comment: socially   . Drug use: No  . Sexual activity: Not on file   Other Topics Concern  . Not on file   Social History Narrative   mother is Elodia Florence, one of my patients    Lives w/ husband    Past Surgical History:  Procedure Laterality Date  . closed hearty valve repair  [place] , PFO repair??    Family History  Problem Relation Age of Onset  . Heart attack Father 48  . Diabetes Mother   . Hypertension Mother     M, F  . Prostate cancer Maternal Grandfather   . Cancer Maternal  Grandfather     late in life   . Breast cancer Neg Hx     Allergies  Allergen Reactions  . Contrave [Naltrexone-Bupropion Hcl Er]     Multiple s/e, hot flashes, back pain, lethargic  . Metformin And Related     Generalize itching, rash (arms)  . Oxycodone-Acetaminophen Nausea And Vomiting  . Penicillins Hives    Has patient had a PCN reaction causing immediate rash, facial/tongue/throat swelling, SOB or lightheadedness with hypotension:unsure Has patient had a PCN reaction causing severe rash involving mucus membranes or skin necrosis:unsure Has patient had a PCN reaction that required hospitalization:No Has patient had a PCN reaction occurring within the last 10 years:No If all of the above answers are "NO", then may proceed with Cephalosporin use. Childhood reaction     Current Outpatient Prescriptions on File Prior to Visit  Medication Sig Dispense Refill  . albuterol (VENTOLIN HFA) 108 (90 Base) MCG/ACT inhaler Inhale 1 puff into the lungs every 6 (six) hours as needed for wheezing or shortness of breath. 18 g 5  . azelastine (ASTELIN) 0.1 % nasal spray Place 2 sprays into both nostrils at bedtime as needed for rhinitis. Use in each nostril as directed 30 mL 3  . cyclobenzaprine (FLEXERIL) 5 MG tablet Take 5-10 mg by mouth 3 (three) times daily as needed for muscle spasms.     Marland Kitchen ibuprofen (ADVIL,MOTRIN) 800 MG tablet Take 800 mg by mouth 3 (three) times daily as needed (for pain.).     Marland Kitchen LO LOESTRIN FE 1 MG-10 MCG / 10 MCG tablet Take 1 tablet by mouth daily.    . mometasone-formoterol (DULERA) 100-5 MCG/ACT AERO Inhale 2 puffs into the lungs 2 (two) times daily. 1 Inhaler 6  . Multiple Vitamin (MULTIVITAMIN WITH MINERALS) TABS tablet Take 1 tablet by mouth daily. ALIVE     No current facility-administered medications on file prior to visit.     BP 116/76 (BP Location: Left Arm, Patient Position: Sitting, Cuff Size: Large)   Pulse 75   Temp 98.3 F (36.8 C) (Oral)   Ht 5\' 5"  (1.651 m)   Wt 300 lb (136.1 kg)   SpO2 98%   BMI 49.92 kg/m      Objective:   Physical Exam  General  Mental Status - Alert. General Appearance - Well groomed. Not in acute distress.  Skin Rashes- No Rashes.  HEENT Head- Normal. Ear Auditory Canal - Left- Normal. Right - Normal.Tympanic Membrane- Left- Normal. Right- Normal. Eye Sclera/Conjunctiva- Left- Normal. Right- Normal. Nose & Sinuses Nasal Mucosa- Left-  Boggy and Congested. Right-  Boggy and  Congested.Bilateral no maxillary and  No frontal sinus pressure. Mouth & Throat Lips: Upper Lip- Normal: no dryness, cracking, pallor, cyanosis, or vesicular eruption. Lower Lip-Normal: no dryness, cracking, pallor, cyanosis or vesicular eruption. Buccal Mucosa- Bilateral- No Aphthous ulcers. Oropharynx- No  Discharge or Erythema. Tonsils: Characteristics- Bilateral- No Erythema or Congestion. Size/Enlargement- Bilateral- No enlargement. Discharge- bilateral-None.  Neck Neck- Supple. No Masses.   Chest and Lung Exam Auscultation: Breath Sounds:-even and unlabored. Mild scattered wheezing.  Cardiovascular Auscultation:Rythm- Regular, rate and rhythm. Murmurs & Other Heart Sounds:Ausculatation of the heart reveal- No Murmurs.  Lymphatic Head & Neck General Head & Neck Lymphatics: Bilateral: Description- No Localized lymphadenopathy.       Assessment & Plan:  You appear to have bronchitis. Rest hydrate and tylenol for fever. I am prescribing cough medicine hycodan  and doxycycline antibiotic.  For your nasal congestion you could use flonase(rx  given)  For wheezing taper prednisone. Continue your 2 inhalers. Watch your sugar intake. Check sugars if sugars exceed 200 while on prednisone stop prednisone and notify us.  Get cxr and cbc today.  Follow up in 7-10 days or as needed  Regarding antibioticchosen. Advised on preg test. She declined. 45 yo. On ocp. Has pituitary disorder. Told infertile so she declines.

## 2016-03-16 MED ORDER — ALBUTEROL SULFATE (2.5 MG/3ML) 0.083% IN NEBU: 2.5000 mg | INHALATION_SOLUTION | Freq: Four times a day (QID) | RESPIRATORY_TRACT | 5 refills | Status: DC | PRN

## 2016-03-16 NOTE — Addendum Note (Signed)
Addended byDamita Dunnings D on: 03/16/2016 10:41 AM   Modules accepted: Orders

## 2016-03-16 NOTE — Telephone Encounter (Signed)
Patient states inhaler is not needed, requesting solution only, please advise

## 2016-03-16 NOTE — Telephone Encounter (Signed)
Nebulizer solution sent

## 2016-03-26 ENCOUNTER — Ambulatory Visit: Payer: BC Managed Care – PPO | Admitting: Internal Medicine

## 2016-04-16 ENCOUNTER — Encounter: Payer: BC Managed Care – PPO | Admitting: Internal Medicine

## 2016-04-22 ENCOUNTER — Encounter: Payer: Self-pay | Admitting: Internal Medicine

## 2016-04-22 ENCOUNTER — Ambulatory Visit (INDEPENDENT_AMBULATORY_CARE_PROVIDER_SITE_OTHER): Payer: BC Managed Care – PPO | Admitting: Internal Medicine

## 2016-04-22 VITALS — BP 124/76 | HR 63 | Temp 98.6°F | Resp 14 | Ht 65.0 in | Wt 300.0 lb

## 2016-04-22 DIAGNOSIS — E119 Type 2 diabetes mellitus without complications: Secondary | ICD-10-CM | POA: Diagnosis not present

## 2016-04-22 DIAGNOSIS — Z23 Encounter for immunization: Secondary | ICD-10-CM | POA: Diagnosis not present

## 2016-04-22 DIAGNOSIS — Z0000 Encounter for general adult medical examination without abnormal findings: Secondary | ICD-10-CM | POA: Diagnosis not present

## 2016-04-22 DIAGNOSIS — D352 Benign neoplasm of pituitary gland: Secondary | ICD-10-CM

## 2016-04-22 LAB — BASIC METABOLIC PANEL
BUN: 10 mg/dL (ref 6–23)
CHLORIDE: 103 meq/L (ref 96–112)
CO2: 29 meq/L (ref 19–32)
CREATININE: 0.87 mg/dL (ref 0.40–1.20)
Calcium: 9.3 mg/dL (ref 8.4–10.5)
GFR: 91.13 mL/min (ref >60.00)
Glucose, Bld: 98 mg/dL (ref 70–99)
Potassium: 3.9 mEq/L (ref 3.5–5.1)
Sodium: 140 mEq/L (ref 135–145)

## 2016-04-22 LAB — CBC WITH DIFFERENTIAL/PLATELET
BASOS ABS: 0 10*3/uL (ref 0.0–0.1)
Basophils Relative: 0.4 % (ref 0.0–3.0)
EOS ABS: 0.1 10*3/uL (ref 0.0–0.7)
Eosinophils Relative: 2 % (ref 0.0–5.0)
HCT: 35.3 % — ABNORMAL LOW (ref 36.0–46.0)
HEMOGLOBIN: 11.8 g/dL — AB (ref 12.0–15.0)
Lymphocytes Relative: 21.1 % (ref 12.0–46.0)
Lymphs Abs: 1.1 10*3/uL (ref 0.7–4.0)
MCHC: 33.3 g/dL (ref 30.0–36.0)
MCV: 89.1 fl (ref 78.0–100.0)
MONO ABS: 0.5 10*3/uL (ref 0.1–1.0)
Monocytes Relative: 9.8 % (ref 3.0–12.0)
Neutro Abs: 3.5 10*3/uL (ref 1.4–7.7)
Neutrophils Relative %: 66.7 % (ref 43.0–77.0)
Platelets: 282 10*3/uL (ref 150.0–400.0)
RBC: 3.96 Mil/uL (ref 3.87–5.11)
RDW: 15 % (ref 11.5–15.5)
WBC: 5.2 10*3/uL (ref 4.0–10.5)

## 2016-04-22 LAB — LIPID PANEL
CHOL/HDL RATIO: 5
Cholesterol: 229 mg/dL — ABNORMAL HIGH (ref 0–200)
HDL: 46.9 mg/dL (ref >39.00)
LDL CALC: 165 mg/dL — AB (ref 0–99)
NonHDL: 182.01
Triglycerides: 86 mg/dL (ref 0.0–149.0)
VLDL: 17.2 mg/dL (ref 0.0–40.0)

## 2016-04-22 LAB — HEMOGLOBIN A1C: HEMOGLOBIN A1C: 6.3 % (ref 4.6–6.5)

## 2016-04-22 NOTE — Assessment & Plan Note (Signed)
DM: Check a A1c Hyperlipidemia: Diet control, check labs Asthma: Uses medication appropriately, hardly ever needs a rescue inhaler Obesity : In addition to diet and exercise I recommend to think about saxenda RTC 6 months

## 2016-04-22 NOTE — Assessment & Plan Note (Addendum)
Td 09; pnm 23:2016 ; Flu shot today Sees gyn Labs: FLP, BMP, A1c, CBC Diet exercise discussed

## 2016-04-22 NOTE — Progress Notes (Signed)
Subjective:    Patient ID: Lauren Johns, female    DOB: 1970-12-31, 46 y.o.   MRN: [id number]  DOS:  04/22/2016 Type of visit - description : CPX Interval history: No major concerns. Still having a hard time losing weight, however  she actually went through the holidays without gaining any weight  Wt Readings from Last 3 Encounters:  04/22/16 300 lb (136.1 kg)  03/15/16 300 lb (136.1 kg)  03/03/16 (!) 301 lb 4 oz (136.6 kg)     Review of Systems  Constitutional: No fever. No chills. No unexplained wt changes. No unusual sweats  HEENT: No dental problems, no ear discharge, no facial swelling, no voice changes. No eye discharge, no eye  redness , no  intolerance to light   Respiratory: No wheezing , no  difficulty breathing. No cough , no mucus production  Cardiovascular: No CP, no leg swelling , no  Palpitations  GI: no nausea, no vomiting, no diarrhea , no  abdominal pain.  No blood in the stools. No dysphagia, no odynophagia    Endocrine: No polyphagia, no polyuria , no polydipsia  GU: No dysuria, gross hematuria, difficulty urinating. No urinary urgency, no frequency.  Musculoskeletal: No joint swellings or unusual aches or pains  Skin: No change in the color of the skin, palor , no  Rash  Allergic, immunologic: No environmental allergies , no  food allergies  Neurological: No dizziness no  syncope. No headaches. No diplopia, no slurred, no slurred speech, no motor deficits, no facial  Numbness  Hematological: No enlarged lymph nodes, no easy bruising , no unusual bleedings  Psychiatry: No suicidal ideas, no hallucinations, no beavior problems, no confusion.  No unusual/severe anxiety, no depression    Past Medical History:  Diagnosis Date  . Abnormal brain MRI   . Allergic rhinitis 05/21/2013  . Arthritis   . Asthma   . Chronic kidney disease   . Depression    h/o   . Diabetes ([place]) 04/03/2013  . Elevated lipids   . GERD (gastroesophageal reflux disease)    . Infertility, female    h/o  . Insomnia   . Obesity   . PFO (patent foramen ovale)    ??PFO repair:  in the 90s, was seen several times after and released (per patient)    Past Surgical History:  Procedure Laterality Date  . closed hearty valve repair  [place] , PFO repair??    Social History   Social History  . Marital status: Married    Spouse name: N/A  . Number of children: 0  . Years of education: N/A   Occupational History  . school teacher -support    Social History Main Topics  . Smoking status: Never Smoker  . Smokeless tobacco: Never Used  . Alcohol use Yes     Comment: socially   . Drug use: No  . Sexual activity: Not on file   Other Topics Concern  . Not on file   Social History Narrative   mother is Elodia Florence, one of my patients    Lives w/ husband     Family History  Problem Relation Age of Onset  . Heart attack Father 44  . Diabetes Mother   . Hypertension Mother     M, F  . Prostate cancer Maternal Grandfather   . Cancer Maternal Grandfather     late in life   . Breast cancer Neg Hx   . Colon cancer  Neg Hx      Allergies as of 04/22/2016      Reactions   Contrave [naltrexone-bupropion Hcl Er]    Multiple s/e, hot flashes, back pain, lethargic   Metformin And Related    Generalize itching, rash (arms)   Oxycodone-acetaminophen Nausea And Vomiting   Penicillins Hives   Has patient had a PCN reaction causing immediate rash, facial/tongue/throat swelling, SOB or lightheadedness with hypotension:unsure Has patient had a PCN reaction causing severe rash involving mucus membranes or skin necrosis:unsure Has patient had a PCN reaction that required hospitalization:No Has patient had a PCN reaction occurring within the last 10 years:No If all of the above answers are "NO", then may proceed with Cephalosporin use. Childhood reaction      Medication List       Accurate as of 04/22/16  1:23 PM. Always use your most recent  med list.          albuterol 108 (90 Base) MCG/ACT inhaler Commonly known as:  VENTOLIN HFA Inhale 1 puff into the lungs every 6 (six) hours as needed for wheezing or shortness of breath.   albuterol (2.5 MG/3ML) 0.083% nebulizer solution Commonly known as:  PROVENTIL Take 3 mLs (2.5 mg total) by nebulization every 6 (six) hours as needed for wheezing or shortness of breath.   azelastine 0.1 % nasal spray Commonly known as:  ASTELIN Place 2 sprays into both nostrils at bedtime as needed for rhinitis. Use in each nostril as directed   fluticasone 50 MCG/ACT nasal spray Commonly known as:  FLONASE Place 2 sprays into both nostrils daily.   LO LOESTRIN FE 1 MG-10 MCG / 10 MCG tablet Generic drug:  Norethindrone-Ethinyl Estradiol-Fe Biphas Take 1 tablet by mouth daily.   mometasone-formoterol 100-5 MCG/ACT Aero Commonly known as:  DULERA Inhale 2 puffs into the lungs 2 (two) times daily.   multivitamin with minerals Tabs tablet Take 1 tablet by mouth daily. ALIVE          Objective:   Physical Exam BP 124/76 (BP Location: Left Arm, Patient Position: Sitting, Cuff Size: Normal)   Pulse 63   Temp 98.6 F (37 C) (Oral)   Resp 14   Ht 5\' 5"  (1.651 m)   Wt 300 lb (136.1 kg)   SpO2 97%   BMI 49.92 kg/m   General:   Well developed, morbidly obese. NAD.  Neck: No  thyromegaly  HEENT:  Normocephalic . Face symmetric, atraumatic Lungs:  CTA B Normal respiratory effort, no intercostal retractions, no accessory muscle use. Heart: RRR,  no murmur.  No pretibial edema bilaterally  Abdomen:  Not distended, soft, non-tender. No rebound or rigidity.   Skin: Exposed areas without rash. Not pale. Not jaundice Neurologic:  alert & oriented X3.  Speech normal, gait appropriate for age and unassisted Strength symmetric and appropriate for age.  Psych: Cognition and judgment appear intact.  Cooperative with normal attention span and concentration.  Behavior appropriate. No  anxious or depressed appearing.    Assessment & Plan:   Assessment   DM aic 6.6 (02-2014) Hyperlipidemia Asthma Insomnia GERD Morbid obesity  Weight: 2008 281 pounds, 2009 275 pounds,  , 10/22/2011 293  Int hemorrhoids dx 05-2015  H/o abnormal brain MRI-- pituitary adenoma used to see Dr Trenton Gammon H/o  depression Birth control --> H/o infertility, on BCP as of 11-2015 H/o close heart surgery at [place], PFO repair?Echo 10-2011: wnl  PLAN: DM: Check a A1c Hyperlipidemia: Diet control, check labs Asthma: Uses  medication appropriately, hardly ever needs a rescue inhaler Obesity : In addition to diet and exercise I recommend to think about saxenda RTC 6 months

## 2016-04-22 NOTE — Patient Instructions (Signed)
GO TO THE LAB : Get the blood work     GO TO THE FRONT DESK Schedule your next appointment for a  routine checkup in 6 months   In addition to increase your physical activity and change your diet habits, think about a medication called SAXENDA

## 2016-04-22 NOTE — Progress Notes (Signed)
Pre visit review using our clinic review tool, if applicable. No additional management support is needed unless otherwise documented below in the visit note. 

## 2016-04-27 ENCOUNTER — Telehealth: Payer: Self-pay

## 2016-04-27 ENCOUNTER — Encounter: Payer: Self-pay | Admitting: Internal Medicine

## 2016-04-27 ENCOUNTER — Ambulatory Visit (INDEPENDENT_AMBULATORY_CARE_PROVIDER_SITE_OTHER): Payer: BC Managed Care – PPO | Admitting: Internal Medicine

## 2016-04-27 VITALS — BP 126/76 | HR 59 | Temp 98.2°F | Resp 12 | Ht 65.0 in | Wt 300.1 lb

## 2016-04-27 DIAGNOSIS — J4521 Mild intermittent asthma with (acute) exacerbation: Secondary | ICD-10-CM

## 2016-04-27 DIAGNOSIS — E785 Hyperlipidemia, unspecified: Secondary | ICD-10-CM | POA: Diagnosis not present

## 2016-04-27 MED ORDER — ATORVASTATIN CALCIUM 20 MG PO TABS: 20.0000 mg | ORAL_TABLET | Freq: Every day | ORAL | 3 refills | Status: DC

## 2016-04-27 MED ORDER — AZITHROMYCIN 250 MG PO TABS: ORAL_TABLET | ORAL | 0 refills | Status: DC

## 2016-04-27 MED ORDER — LIRAGLUTIDE -WEIGHT MANAGEMENT 18 MG/3ML ~~LOC~~ SOPN: 3.0000 mg | PEN_INJECTOR | Freq: Every day | SUBCUTANEOUS | 1 refills | Status: DC

## 2016-04-27 MED ORDER — PREDNISONE 10 MG PO TABS: ORAL_TABLET | ORAL | 0 refills | Status: DC

## 2016-04-27 MED ORDER — INSULIN PEN NEEDLE 32G X 6 MM MISC: 1 refills | Status: DC

## 2016-04-27 MED ORDER — BENZONATATE 200 MG PO CAPS: 200.0000 mg | ORAL_CAPSULE | Freq: Three times a day (TID) | ORAL | 0 refills | Status: DC | PRN

## 2016-04-27 NOTE — Telephone Encounter (Signed)
Received PA approval. Medication approved through 08/25/2016.

## 2016-04-27 NOTE — Progress Notes (Signed)
Subjective:    Patient ID: Lauren Johns, female    DOB: [birth year], 46 y.o.   MRN: [id number]  DOS:  04/27/2016 Type of visit - description : acute Interval history: Sx started ~ 4 - 5 days ago Postnasal dripping, cough, increased wheezing. She is using appropriately all her asthma medications  Also, ready to start saxenda  Review of Systems   No fever chills No nausea vomiting No sore throat No myalgias + Green sputum production, mild Past Medical History:  Diagnosis Date  . Abnormal brain MRI   . Allergic rhinitis 05/21/2013  . Arthritis   . Asthma   . Chronic kidney disease   . Depression    h/o   . Diabetes ([place]) 04/03/2013  . Elevated lipids   . GERD (gastroesophageal reflux disease)   . Infertility, female    h/o  . Insomnia   . Obesity   . PFO (patent foramen ovale)    ??PFO repair:  in the 90s, was seen several times after and released (per patient)    Past Surgical History:  Procedure Laterality Date  . closed hearty valve repair  [place] , PFO repair??    Social History   Social History  . Marital status: Married    Spouse name: N/A  . Number of children: 0  . Years of education: N/A   Occupational History  . school teacher -support    Social History Main Topics  . Smoking status: Never Smoker  . Smokeless tobacco: Never Used  . Alcohol use Yes     Comment: socially   . Drug use: No  . Sexual activity: Not on file   Other Topics Concern  . Not on file   Social History Narrative   mother is Elodia Florence, one of my patients    Lives w/ husband      Allergies as of 04/27/2016      Reactions   Contrave [naltrexone-bupropion Hcl Er]    Multiple s/e, hot flashes, back pain, lethargic   Metformin And Related    Generalize itching, rash (arms)   Oxycodone-acetaminophen Nausea And Vomiting   Penicillins Hives   Has patient had a PCN reaction causing immediate rash, facial/tongue/throat swelling, SOB or lightheadedness with  hypotension:unsure Has patient had a PCN reaction causing severe rash involving mucus membranes or skin necrosis:unsure Has patient had a PCN reaction that required hospitalization:No Has patient had a PCN reaction occurring within the last 10 years:No If all of the above answers are "NO", then may proceed with Cephalosporin use. Childhood reaction      Medication List       Accurate as of 04/27/16  9:31 PM. Always use your most recent med list.          albuterol 108 (90 Base) MCG/ACT inhaler Commonly known as:  VENTOLIN HFA Inhale 1 puff into the lungs every 6 (six) hours as needed for wheezing or shortness of breath.   albuterol (2.5 MG/3ML) 0.083% nebulizer solution Commonly known as:  PROVENTIL Take 3 mLs (2.5 mg total) by nebulization every 6 (six) hours as needed for wheezing or shortness of breath.   atorvastatin 20 MG tablet Commonly known as:  LIPITOR Take 1 tablet (20 mg total) by mouth daily.   azelastine 0.1 % nasal spray Commonly known as:  ASTELIN Place 2 sprays into both nostrils at bedtime as needed for rhinitis. Use in each nostril as directed   azithromycin 250 MG tablet Commonly  known as:  ZITHROMAX Z-PAK 2 tabs a day the first day, then 1 tab a day x 4 days   benzonatate 200 MG capsule Commonly known as:  TESSALON Take 1 capsule (200 mg total) by mouth 3 (three) times daily as needed for cough.   fluticasone 50 MCG/ACT nasal spray Commonly known as:  FLONASE Place 2 sprays into both nostrils daily.   Insulin Pen Needle 32G X 6 MM Misc Commonly known as:  NOVOFINE To use daily w/ Saxenda   Liraglutide -Weight Management 18 MG/3ML Sopn Commonly known as:  SAXENDA Inject 3 mg into the skin daily. Use dosing guidelines per MD.   LO LOESTRIN FE 1 MG-10 MCG / 10 MCG tablet Generic drug:  Norethindrone-Ethinyl Estradiol-Fe Biphas Take 1 tablet by mouth daily.   mometasone-formoterol 100-5 MCG/ACT Aero Commonly known as:  DULERA Inhale 2 puffs  into the lungs 2 (two) times daily.   multivitamin with minerals Tabs tablet Take 1 tablet by mouth daily. ALIVE   predniSONE 10 MG tablet Commonly known as:  DELTASONE 2 tabs a day x 5 days          Objective:   Physical Exam BP 126/76 (BP Location: Left Arm, Patient Position: Sitting, Cuff Size: Normal)   Pulse (!) 59   Temp 98.2 F (36.8 C) (Oral)   Resp 12   Ht 5\' 5"  (1.651 m)   Wt (!) 300 lb 2 oz (136.1 kg)   SpO2 97%   BMI 49.94 kg/m  General:   Well developed, well nourished . NAD.  HEENT:  Normocephalic . Face symmetric, atraumatic. Nose congested, sinuses symmetrically TTP maxillary and frontal. TMs normal. Throat normal. Lungs:  Few rhonchi and wheezes bilaterally. No crackles. No distress Normal respiratory effort, no intercostal retractions, no accessory muscle use. Heart: RRR,  no murmur.  No pretibial edema bilaterally  Skin: Not pale. Not jaundice Neurologic:  alert & oriented X3.  Speech normal, gait appropriate for age and unassisted Psych--  Cognition and judgment appear intact.  Cooperative with normal attention span and concentration.  Behavior appropriate. No anxious or depressed appearing.      Assessment & Plan:   Assessment   DM aic 6.6 (02-2014) Hyperlipidemia Asthma Insomnia GERD Morbid obesity  Weight: 2008 281 pounds, 2009 275 pounds,  , 10/22/2011 293  Int hemorrhoids dx 05-2015  H/o abnormal brain MRI-- pituitary adenoma used to see Dr Trenton Gammon H/o  depression Birth control --> H/o infertility, on BCP as of 11-2015 H/o close heart surgery at [facility], PFO repair?[place]cho 10-2011: wnl  PLAN: Asthma exacerbation: Likely due to a viral URI, continue regular asthma medications including rescue inhalers, prednisone for 5 days, if not better to start a Z-Pak. We also send a prescription for Tessalon Perles for better cough control at night. Hyperlipidemia: See labs, start Lipitor. Labs in 6 weeks Obesity, she is electing to start  Saxenda, will start the P.A.. Again she is aware that this is just a tool to  help her not a solution, must change her lifestyle.

## 2016-04-27 NOTE — Patient Instructions (Addendum)
Rest, fluids , tylenol  For cough:  Take Mucinex DM twice a day as needed until better  Continue  your nasal sprays  Continue your regular asthma medication  Take prednisone for 5 days    Take the antibiotic as prescribed  (zithromax) only if no better in 3-4 days   Call if not gradually better over the next  10 days  Call anytime if the symptoms are severe  =========================  Start Lipitor 1 tablet at bedtime  Come back in 6 weeks for blood work: FLP, AST, ALT. Please make an appointment

## 2016-04-27 NOTE — Progress Notes (Signed)
Pre visit review using our clinic review tool, if applicable. No additional management support is needed unless otherwise documented below in the visit note. 

## 2016-04-27 NOTE — Telephone Encounter (Signed)
[facility] pharmacy informed of PA approval. Informed pharmacist that Pt will need to be taught how to self-inject medication. Verbalization given. PA notification letter sent for scanning.

## 2016-04-27 NOTE — Assessment & Plan Note (Signed)
Asthma exacerbation: Likely due to a viral URI, continue regular asthma medications including rescue inhalers, prednisone for 5 days, if not better to start a Z-Pak. We also send a prescription for Tessalon Perles for better cough control at night. Hyperlipidemia: See labs, start Lipitor. Labs in 6 weeks Obesity, she is electing to start Saxenda, will start the P.A.. Again she is aware that this is just a tool to  help her not a solution, must change her lifestyle.

## 2016-04-27 NOTE — Telephone Encounter (Signed)
PA initiated via Covermymeds; KEY: [id number]. Awaiting determination.

## 2016-04-30 ENCOUNTER — Telehealth: Payer: Self-pay | Admitting: Internal Medicine

## 2016-04-30 ENCOUNTER — Other Ambulatory Visit: Payer: Self-pay

## 2016-04-30 MED ORDER — HYDROCODONE-HOMATROPINE 5-1.5 MG/5ML PO SYRP: 5.0000 mL | ORAL_SOLUTION | Freq: Every evening | ORAL | 0 refills | Status: DC | PRN

## 2016-04-30 MED ORDER — INSULIN PEN NEEDLE 32G X 6 MM MISC: 1 refills | Status: DC

## 2016-04-30 MED FILL — BD PEN NDL SHORT 31GX5/16: 31G X 8 MM | 90 days supply | Qty: 100 | Fill #0

## 2016-04-30 MED FILL — BD PEN NDL SHORT 31GX5/16": 31G X 8 MM | 90 days supply | Qty: 100 | Fill #0

## 2016-04-30 NOTE — Telephone Encounter (Signed)
Patient called stated she is requesting a medication for cough at night. Says she is coughing a lot at night and would like a cough syrup to help. She was seen in office for this issue 04/27/16 call back is [phone] she use [facility] on [address]

## 2016-04-30 NOTE — Telephone Encounter (Signed)
I just printed a prescription for hydrocodone, advise patient to pick it up, watch for drowsiness

## 2016-04-30 NOTE — Telephone Encounter (Signed)
Rx re-sent for BD pen needles.

## 2016-04-30 NOTE — Telephone Encounter (Signed)
[place], [place] - [place] [phone] (Phone) [phone] (Fax)  Reason for call:  As per pharmacy insurance doesn't cover Insulin Pen Needle (NOVOFINE) 32G X 6 MM MISC requesting BD insulin pen needle, please advise

## 2016-04-30 NOTE — Telephone Encounter (Signed)
Spoke w/ Pt, informed that Rx has been placed at front desk for pick up. Pt verbalized understanding. She also informed that pen needles that go w/ Saxenda are not covered by insurance, informed she maybe able to get the pen needles downstairs at our pharmacy cheaper. Rx sent.

## 2016-04-30 NOTE — Telephone Encounter (Signed)
Please advise 

## 2016-05-31 ENCOUNTER — Other Ambulatory Visit: Payer: Self-pay | Admitting: Neurosurgery

## 2016-05-31 DIAGNOSIS — D352 Benign neoplasm of pituitary gland: Secondary | ICD-10-CM

## 2016-06-09 ENCOUNTER — Other Ambulatory Visit: Payer: BC Managed Care – PPO

## 2016-06-14 ENCOUNTER — Emergency Department (HOSPITAL_COMMUNITY)
Admission: EM | Admit: 2016-06-14 | Discharge: 2016-06-15 | Disposition: A | Discharge status: Home / Self Care | Payer: BC Managed Care – PPO | Attending: Emergency Medicine | Admitting: Emergency Medicine

## 2016-06-14 ENCOUNTER — Ambulatory Visit
Admission: RE | Admit: 2016-06-14 | Discharge: 2016-06-14 | Disposition: A | Discharge status: Home / Self Care | Payer: BC Managed Care – PPO | Source: Ambulatory Visit | Attending: Neurosurgery | Admitting: Neurosurgery

## 2016-06-14 ENCOUNTER — Encounter (HOSPITAL_COMMUNITY): Payer: Self-pay | Admitting: Emergency Medicine

## 2016-06-14 ENCOUNTER — Other Ambulatory Visit: Payer: BC Managed Care – PPO

## 2016-06-14 DIAGNOSIS — N189 Chronic kidney disease, unspecified: Secondary | ICD-10-CM | POA: Diagnosis not present

## 2016-06-14 DIAGNOSIS — D352 Benign neoplasm of pituitary gland: Secondary | ICD-10-CM

## 2016-06-14 DIAGNOSIS — T7840XA Allergy, unspecified, initial encounter: Secondary | ICD-10-CM | POA: Insufficient documentation

## 2016-06-14 DIAGNOSIS — J45909 Unspecified asthma, uncomplicated: Secondary | ICD-10-CM | POA: Insufficient documentation

## 2016-06-14 DIAGNOSIS — Z79899 Other long term (current) drug therapy: Secondary | ICD-10-CM | POA: Insufficient documentation

## 2016-06-14 DIAGNOSIS — Z794 Long term (current) use of insulin: Secondary | ICD-10-CM | POA: Diagnosis not present

## 2016-06-14 DIAGNOSIS — E1122 Type 2 diabetes mellitus with diabetic chronic kidney disease: Secondary | ICD-10-CM | POA: Insufficient documentation

## 2016-06-14 MED ORDER — GADOBENATE DIMEGLUMINE 529 MG/ML IV SOLN
10.0000 mL | Freq: Once | INTRAVENOUS | Status: AC | PRN
  Administered 2016-06-14: 10 mL via INTRAVENOUS

## 2016-06-14 MED ORDER — FAMOTIDINE 20 MG PO TABS: 20.0000 mg | ORAL_TABLET | Freq: Two times a day (BID) | ORAL | 0 refills | Status: DC

## 2016-06-14 MED ORDER — FAMOTIDINE IN NACL 20-0.9 MG/50ML-% IV SOLN
20.0000 mg | Freq: Once | INTRAVENOUS | Status: AC
  Administered 2016-06-14: 20 mg via INTRAVENOUS
  Filled 2016-06-14: qty 50

## 2016-06-14 MED ORDER — PREDNISONE 50 MG PO TABS: ORAL_TABLET | ORAL | 0 refills | Status: DC

## 2016-06-14 MED ORDER — DIPHENHYDRAMINE HCL 50 MG/ML IJ SOLN
25.0000 mg | Freq: Once | INTRAMUSCULAR | Status: AC
  Administered 2016-06-14: 25 mg via INTRAVENOUS
  Filled 2016-06-14: qty 1

## 2016-06-14 MED ORDER — METHYLPREDNISOLONE SODIUM SUCC 125 MG IJ SOLR
125.0000 mg | Freq: Once | INTRAMUSCULAR | Status: AC
  Administered 2016-06-14: 125 mg via INTRAVENOUS
  Filled 2016-06-14: qty 2

## 2016-06-14 NOTE — ED Triage Notes (Addendum)
Patient from [facility], reports after MRI at 6pm, she noticed rash to right arm and generalized itching. After 50mg  Bendaryl and no improvement at the office, the radiologist suggested patient come to be evaluated. Denies SOB and difficulty breathing. IV in right [facility]

## 2016-06-14 NOTE — Discharge Instructions (Signed)
Use Benadryl as directed for itching

## 2016-06-14 NOTE — Progress Notes (Signed)
Patient ID: Lauren Johns, female   DOB: Jan 17, 1971, 46 y.o.   MRN: [id number] Patient had a MRI with contrast this evening at [facility].  Following the procedure, patient complained of itching but no significant swelling or redness.  Patient was given 25 mg of benadryl and watched for approximately 30 minutes.  The itchy continued and patient started to develop swelling and redness in the right upper extremity (same arm as the peripheral IV).  No shortness breath or wheezing.  Patient was given another 25 mg of benadryl.  Due to the worsening symptoms, I recommended that she go to the emergency department for additional monitoring and possible steroid administration. Patient wanted to go to [facility] and I called the ED to expect her arrival.  Patient left for the hospital with her husband and the peripheral IV was still intact.

## 2016-06-14 NOTE — ED Provider Notes (Signed)
[facility] DEPT Provider Note   CSN: [id number] Arrival date & time: 06/14/16  2015     History   Chief Complaint Chief Complaint  Patient presents with  . Allergic Reaction    HPI Lauren Johns is a 46 y.o. female.  46 year old female presents with right arm itching after having an MRI with contrast. Was given 50 mg of Benadryl and does feel better. States her arm is swollen but has been improving since her the Benadryl. Denies any trouble swallowing. She is not short of breath. Does not feel lightheaded or dizzy. No prior history of contrast allergy.      Past Medical History:  Diagnosis Date  . Abnormal brain MRI   . Allergic rhinitis 05/21/2013  . Arthritis   . Asthma   . Chronic kidney disease   . Depression    h/o   . Diabetes ([place]) 04/03/2013  . Elevated lipids   . GERD (gastroesophageal reflux disease)   . Infertility, female    h/o  . Insomnia   . Obesity   . PFO (patent foramen ovale)    ??PFO repair:  in the 90s, was seen several times after and released (per patient)    Patient Active Problem List   Diagnosis Date Noted  . Internal hemorrhoids 05/14/2015  . PCP notes ------------ 12/25/2014  . Abnormal brain MRI-- pituitary adenoma  03/20/2014  . Elevated lipids 03/20/2014  . Allergic rhinitis 05/21/2013  . Headache, migraine 05/21/2013  . Diabetes ([place]) 04/03/2013  . Dyspnea on exertion 06/29/2012  . PFO (patent foramen ovale) 10/11/2011  . Acne 10/11/2011  . Annual physical exam 10/26/2010  . GERD 09/11/2008  . DEPRESSION 04/20/2007  . INSOMNIA-SLEEP DISORDER-UNSPEC 04/19/2007  . Morbid obesity ([place]) 12/13/2006  . Asthma, chronic 12/13/2006    Past Surgical History:  Procedure Laterality Date  . closed hearty valve repair  [place] , PFO repair??    OB History    No data available       Home Medications    Prior to Admission medications   Medication Sig Start Date End Date Taking? Authorizing Provider    acetaminophen (TYLENOL) 500 MG tablet Take 1,000 mg by mouth every 6 (six) hours as needed.   Yes Historical Provider, MD  Multiple Vitamin (MULTIVITAMIN WITH MINERALS) TABS tablet Take 1 tablet by mouth daily. ALIVE   Yes Historical Provider, MD  albuterol (PROVENTIL) (2.5 MG/3ML) 0.083% nebulizer solution Take 3 mLs (2.5 mg total) by nebulization every 6 (six) hours as needed for wheezing or shortness of breath. 03/16/16   Colon Branch, MD  albuterol (VENTOLIN HFA) 108 (90 Base) MCG/ACT inhaler Inhale 1 puff into the lungs every 6 (six) hours as needed for wheezing or shortness of breath. 03/12/16   Colon Branch, MD  atorvastatin (LIPITOR) 20 MG tablet Take 1 tablet (20 mg total) by mouth daily. 04/27/16   Colon Branch, MD  azelastine (ASTELIN) 0.1 % nasal spray Place 2 sprays into both nostrils at bedtime as needed for rhinitis. Use in each nostril as directed 03/03/16   Colon Branch, MD  azithromycin (ZITHROMAX Z-PAK) 250 MG tablet 2 tabs a day the first day, then 1 tab a day x 4 days 04/27/16   Colon Branch, MD  benzonatate (TESSALON) 200 MG capsule Take 1 capsule (200 mg total) by mouth 3 (three) times daily as needed for cough. 04/27/16   Colon Branch, MD  fluticasone [facility]) 50 MCG/ACT nasal  spray Place 2 sprays into both nostrils daily. 03/15/16   Edward Saguier, PA-C  HYDROcodone-homatropine (HYCODAN) 5-1.5 MG/5ML syrup Take 5 mLs by mouth at bedtime as needed for cough. 04/30/16   Colon Branch, MD  Insulin Pen Needle (BD ULTRA-FINE MICRO PEN NEEDLE) 32G X 6 MM MISC To use w/ Saxenda 04/30/16   Colon Branch, MD  Liraglutide -Weight Management (SAXENDA) 18 MG/3ML SOPN Inject 3 mg into the skin daily. Use dosing guidelines per MD. 04/27/16   Colon Branch, MD  LO LOESTRIN FE 1 MG-10 MCG / 10 MCG tablet Take 1 tablet by mouth daily. 10/31/15   Historical Provider, MD  mometasone-formoterol (DULERA) 100-5 MCG/ACT AERO Inhale 2 puffs into the lungs 2 (two) times daily. 11/21/15   Colon Branch, MD  predniSONE (DELTASONE) 10  MG tablet 2 tabs a day x 5 days 04/27/16   Colon Branch, MD    Family History Family History  Problem Relation Age of Onset  . Heart attack Father 7  . Diabetes Mother   . Hypertension Mother     M, F  . Prostate cancer Maternal Grandfather   . Cancer Maternal Grandfather     late in life   . Breast cancer Neg Hx   . Colon cancer Neg Hx     Social History Social History  Substance Use Topics  . Smoking status: Never Smoker  . Smokeless tobacco: Never Used  . Alcohol use Yes     Comment: socially      Allergies   Contrave [naltrexone-bupropion hcl er]; Gadolinium derivatives; Metformin and related; Oxycodone-acetaminophen; Penicillins; and Contrast media [iodinated diagnostic agents]   Review of Systems Review of Systems  All other systems reviewed and are negative.    Physical Exam Updated Vital Signs BP 149/75 (BP Location: Left Arm)   Pulse 69   Temp 98.9 F (37.2 C) (Oral)   Resp 18   Ht 5\' 5"  (1.651 m)   Wt 132.5 kg   SpO2 95%   BMI 48.59 kg/m   Physical Exam  Constitutional: She is oriented to person, place, and time. She appears well-developed and well-nourished.  Non-toxic appearance. No distress.  HENT:  Head: Normocephalic and atraumatic.  Eyes: Conjunctivae, EOM and lids are normal. Pupils are equal, round, and reactive to light.  Neck: Normal range of motion. Neck supple. No tracheal deviation present. No thyroid mass present.  Cardiovascular: Normal rate, regular rhythm and normal heart sounds.  Exam reveals no gallop.   No murmur heard. Pulmonary/Chest: Effort normal and breath sounds normal. No stridor. No respiratory distress. She has no decreased breath sounds. She has no wheezes. She has no rhonchi. She has no rales.  Abdominal: Soft. Normal appearance and bowel sounds are normal. She exhibits no distension. There is no tenderness. There is no rebound and no CVA tenderness.  Musculoskeletal: Normal range of motion. She exhibits no edema or  tenderness.       Arms: Neurological: She is alert and oriented to person, place, and time. She has normal strength. No cranial nerve deficit or sensory deficit. GCS eye subscore is 4. GCS verbal subscore is 5. GCS motor subscore is 6.  Skin: Skin is warm and dry. No abrasion and no rash noted.  Psychiatric: She has a normal mood and affect. Her speech is normal and behavior is normal.  Nursing note and vitals reviewed.    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 Subsequent encounter. EXAM: MRI HEAD WITHOUT AND WITH CONTRAST TECHNIQUE: Multiplanar, multiecho pulse sequences of the brain and surrounding structures were obtained without and with intravenous contrast. CONTRAST:  56mL MULTIHANCE GADOBENATE DIMEGLUMINE 529 MG/ML IV SOLN Following contrast administration the patient experienced itching which progressed to upper extremity rashes despite 25 mg of oral Benadryl. The patient had a driver and Dr. Anselm Pancoast referred her to the emergency department for further evaluation and treatment. COMPARISON:  Brain MRI 02/26/2015 and earlier. FINDINGS: Brain: Stable cerebral volume. No restricted diffusion to suggest acute infarction. No midline shift, mass effect, evidence of mass lesion, ventriculomegaly, extra-axial collection or acute intracranial hemorrhage. Cervicomedullary junction within normal limits. Nodular left lateral periventricular white matter T2 and FLAIR hyperintensity is stable since 2015. Similar mild anterior right frontal lobe periventricular white matter signal abnormality limited to the frontal horn. Mild chronic involvement of the anterior left temporal lobe (series 7, image 14). No new white matter lesions. No cortical  encephalomalacia. No chronic cerebral blood products. Deep gray matter nuclei are stable and within normal limits. Brainstem and cerebellum appear stable and within normal limits. Excluding the pituitary region, no abnormal enhancement. No dural thickening. Vascular: Major intracranial vascular flow voids are stable and within normal limits. Skull and upper cervical spine: Stable visible bone marrow signal. Negative visualized cervical spine. Sinuses/Orbits: Visualized paranasal sinuses and mastoids are stable and well pneumatized. Negative orbits soft tissues. Negative scalp soft tissues. Other: Dedicated pituitary imaging. Overall pituitary size is within normal limits, there is a partially empty sella configuration again noted. The infundibulum is normal. No suprasellar mass or mass effect. Normal hypothalamus. Mild asymmetric thickening of the right aspect of the gland appears unchanged since 2016. There is no discrete or hypoenhancing area within the gland. The cavernous sinuses remain normal. IMPRESSION: 1. Mild contrast reaction which did not resolve initially following Benadryl (see Contrast section). Consider steroid premedication prior to future MRI IV contrast administration. 2. Stable mild asymmetric enlargement of the right pituitary gland since 2016. This could reflect a pituitary microadenoma or normal anatomic variant. Underlying partially empty sella configuration again noted. 3. Stable left greater than right periventricular white matter signal abnormality since 2015. As before this is nonspecific but has a configuration suspicious for multiple sclerosis. Electronically Signed   By: Genevie Ann M.D.   On: 06/14/2016 20:48    Procedures Procedures (including critical care time)  Medications Ordered in ED Medications  famotidine (PEPCID) IVPB 20 mg premix (20 mg Intravenous New Bag/Given 06/14/16 2048)  methylPREDNISolone sodium succinate (SOLU-MEDROL) 125 mg/2 mL injection 125 mg (125 mg  Intravenous Given 06/14/16 2040)     Initial Impression / Assessment and Plan / ED Course  I have reviewed the triage vital signs and the nursing notes.  Pertinent labs & imaging results that were available during my care of the patient were reviewed by me and considered in my medical decision making (see chart for details).     Patient medicated here with Pepcid  Medrol and Benadryl. Monitored here and her swelling and erythema have improved. She has no airway compromise. Will be discharged on a short course of prednisone as well as given Pepcid. She'll also take Benadryl as needed  Final Clinical Impressions(s) / ED Diagnoses  Final diagnoses:  None    New Prescriptions New Prescriptions   No medications on file     Lacretia Leigh, MD 06/14/16 2255

## 2016-06-22 ENCOUNTER — Other Ambulatory Visit: Payer: BC Managed Care – PPO

## 2016-10-22 ENCOUNTER — Ambulatory Visit: Payer: BC Managed Care – PPO | Admitting: Internal Medicine

## 2016-11-05 ENCOUNTER — Ambulatory Visit: Payer: BC Managed Care – PPO | Admitting: Internal Medicine

## 2016-12-20 ENCOUNTER — Encounter: Payer: Self-pay | Admitting: Internal Medicine

## 2016-12-20 ENCOUNTER — Ambulatory Visit (INDEPENDENT_AMBULATORY_CARE_PROVIDER_SITE_OTHER): Payer: BC Managed Care – PPO | Admitting: Internal Medicine

## 2016-12-20 ENCOUNTER — Telehealth: Payer: Self-pay

## 2016-12-20 VITALS — BP 128/68 | HR 68 | Temp 98.1°F | Resp 14 | Ht 65.0 in | Wt 301.5 lb

## 2016-12-20 DIAGNOSIS — E119 Type 2 diabetes mellitus without complications: Secondary | ICD-10-CM

## 2016-12-20 DIAGNOSIS — E785 Hyperlipidemia, unspecified: Secondary | ICD-10-CM | POA: Diagnosis not present

## 2016-12-20 DIAGNOSIS — J069 Acute upper respiratory infection, unspecified: Secondary | ICD-10-CM | POA: Diagnosis not present

## 2016-12-20 MED ORDER — ALBUTEROL SULFATE HFA 108 (90 BASE) MCG/ACT IN AERS: 1.0000 | INHALATION_SPRAY | Freq: Four times a day (QID) | RESPIRATORY_TRACT | 5 refills | Status: AC | PRN

## 2016-12-20 MED ORDER — MOMETASONE FURO-FORMOTEROL FUM 100-5 MCG/ACT IN AERO: 2.0000 | INHALATION_SPRAY | Freq: Two times a day (BID) | RESPIRATORY_TRACT | 5 refills | Status: DC

## 2016-12-20 MED ORDER — ALBUTEROL SULFATE (2.5 MG/3ML) 0.083% IN NEBU: 2.5000 mg | INHALATION_SOLUTION | Freq: Four times a day (QID) | RESPIRATORY_TRACT | 5 refills | Status: DC | PRN

## 2016-12-20 MED ORDER — AZELASTINE HCL 0.1 % NA SOLN: 2.0000 | Freq: Every evening | NASAL | 5 refills | Status: DC | PRN

## 2016-12-20 MED FILL — AZELASTINE 0.1% (137 MCG) S: 0.1 | 50 days supply | Qty: 30 | Fill #0

## 2016-12-20 MED FILL — PROAIR HFA 90 MCG INHALER: 108 (90 BAS | 50 days supply | Qty: 9 | Fill #0

## 2016-12-20 MED FILL — ALBUTEROL 0.083% INHAL SOLN: (2.5 MG/3ML | 13 days supply | Qty: 150 | Fill #0

## 2016-12-20 NOTE — Telephone Encounter (Signed)
PA initiated via Covermymeds; KEY: [id number]. Awaiting determination.

## 2016-12-20 NOTE — Telephone Encounter (Signed)
Dulera denied, Pt must try and fail 3 of the 3 alternatives before Dulera will be covered. Covered alternatives: Symbicort (Pt has already tried and failed), Advair, and Breo Ellipta. Please advise.

## 2016-12-20 NOTE — Progress Notes (Signed)
Subjective:    Patient ID: Lauren Johns, female    DOB: 1970-11-25, 46 y.o.   MRN: [id number]  DOS:  12/20/2016 Type of visit - description : acute Interval history:  Symptoms started 2 days ago with cough, today she got much worse with nasal congestion, greenish discharge. Mild wheezing. Medications reviewed, needs multiple refills.   Review of Systems Denies fever chills Cough is dry, no sputum production. No chest pain or difficulty breathing   Past Medical History:  Diagnosis Date  . Abnormal brain MRI   . Allergic rhinitis 05/21/2013  . Arthritis   . Asthma   . Chronic kidney disease   . Depression    h/o   . Diabetes ([place]) 04/03/2013  . Elevated lipids   . GERD (gastroesophageal reflux disease)   . Infertility, female    h/o  . Insomnia   . Obesity   . PFO (patent foramen ovale)    ??PFO repair:  in the 90s, was seen several times after and released (per patient)    Past Surgical History:  Procedure Laterality Date  . closed hearty valve repair  [place] , PFO repair??    Social History   Social History  . Marital status: Married    Spouse name: N/A  . Number of children: 0  . Years of education: N/A   Occupational History  . school teacher -support    Social History Main Topics  . Smoking status: Never Smoker  . Smokeless tobacco: Never Used  . Alcohol use Yes     Comment: socially   . Drug use: No  . Sexual activity: Not on file   Other Topics Concern  . Not on file   Social History Narrative   mother is Elodia Florence, one of my patients    Lives w/ husband      Allergies as of 12/20/2016      Reactions   Contrave [naltrexone-bupropion Hcl Er]    Multiple s/e, hot flashes, back pain, lethargic   Gadolinium Derivatives Itching, Rash   Worsening symptoms 30 min after oral benadryl.  Pt was referred to ED by Dr. Anselm Pancoast.   Metformin And Related    Generalize itching, rash (arms)   Oxycodone-acetaminophen Nausea And Vomiting   Penicillins Hives   Has patient had a PCN reaction causing immediate rash, facial/tongue/throat swelling, SOB or lightheadedness with hypotension:unsure Has patient had a PCN reaction causing severe rash involving mucus membranes or skin necrosis:unsure Has patient had a PCN reaction that required hospitalization:No Has patient had a PCN reaction occurring within the last 10 years:No If all of the above answers are "NO", then may proceed with Cephalosporin use. Childhood reaction   Contrast Media [iodinated Diagnostic Agents] Itching, Rash      Medication List       Accurate as of 12/20/16 11:59 PM. Always use your most recent med list.          acetaminophen 500 MG tablet Commonly known as:  TYLENOL Take 1,000 mg by mouth every 6 (six) hours as needed.   albuterol (2.5 MG/3ML) 0.083% nebulizer solution Commonly known as:  PROVENTIL Take 3 mLs (2.5 mg total) by nebulization every 6 (six) hours as needed for wheezing or shortness of breath.   albuterol 108 (90 Base) MCG/ACT inhaler Commonly known as:  VENTOLIN HFA Inhale 1 puff into the lungs every 6 (six) hours as needed for wheezing or shortness of breath.   azelastine 0.1 % nasal  spray Commonly known as:  ASTELIN Place 2 sprays into both nostrils at bedtime as needed for rhinitis. Use in each nostril as directed   famotidine 20 MG tablet Commonly known as:  PEPCID Take 1 tablet (20 mg total) by mouth 2 (two) times daily.   fluticasone 50 MCG/ACT nasal spray Commonly known as:  FLONASE Place 2 sprays into both nostrils daily.   mometasone-formoterol 100-5 MCG/ACT Aero Commonly known as:  DULERA Inhale 2 puffs into the lungs 2 (two) times daily.   multivitamin with minerals Tabs tablet Take 1 tablet by mouth daily. ALIVE            Discharge Care Instructions        Start     Ordered   12/20/16 0539  Basic metabolic panel     76/73/41 1505   12/20/16 0000  Hemoglobin A1c     12/20/16 1505   12/20/16 0000   Urine Microalbumin w/creat. ratio     12/20/16 1505   12/20/16 0000  albuterol (PROVENTIL) (2.5 MG/3ML) 0.083% nebulizer solution  Every 6 hours PRN     12/20/16 1506   12/20/16 0000  albuterol (VENTOLIN HFA) 108 (90 Base) MCG/ACT inhaler  Every 6 hours PRN     12/20/16 1506   12/20/16 0000  azelastine (ASTELIN) 0.1 % nasal spray  At bedtime PRN     12/20/16 1506   12/20/16 0000  mometasone-formoterol (DULERA) 100-5 MCG/ACT AERO  2 times daily     12/20/16 1506         Objective:   Physical Exam BP 128/68 (BP Location: Left Arm, Patient Position: Sitting, Cuff Size: Normal)   Pulse 68   Temp 98.1 F (36.7 C) (Oral)   Resp 14   Ht 5\' 5"  (1.651 m)   Wt (!) 301 lb 8 oz (136.8 kg)   SpO2 96%   BMI 50.17 kg/m  General:   Well developed, well nourished . NAD.  HEENT:  Normocephalic . Face symmetric, atraumatic. Nose quite congested, maxillary sinuses no TTP, slightly TTP at the frontal sinuses bilaterally Lungs:  No rhonchi, slightly increased expiratory time. Normal respiratory effort, no intercostal retractions, no accessory muscle use. Heart: RRR,  no murmur.  No pretibial edema bilaterally  Skin: Not pale. Not jaundice Neurologic:  alert & oriented X3.  Speech normal, gait appropriate for age and unassisted Psych--  Cognition and judgment appear intact.  Cooperative with normal attention span and concentration.  Behavior appropriate. No anxious or depressed appearing.      Assessment & Plan:  Assessment   DM aic 6.6 (02-2014) Hyperlipidemia Asthma Insomnia GERD Morbid obesity  Weight: 2008 281 pounds, 2009 275 pounds,  , 10/22/2011 293  Int hemorrhoids dx 05-2015  H/o abnormal brain MRI-- pituitary adenoma used to see Dr Trenton Gammon H/o  depression Birth control --> H/o infertility, on BCP as of 11-2015 H/o close heart surgery at [place], PFO repair?Echo 10-2011: wnl  PLAN: URI: Conservative treatment and also refill her albuterol, Dulera, Astelin. If not  better she will let me know Asthma: RF her medication as above but otherwise rec  to see her allergies Hyperlipidemia: Tried briefly Lipitor but developed stomach pain and stopped. Morbid obesity: We got SAXENDA for her, she tried, had s/e (headaches) and stopped. DM: Diet control, check a BMP and A1c RTC 04-2017, CPX

## 2016-12-20 NOTE — Progress Notes (Signed)
Pre visit review using our clinic review tool, if applicable. No additional management support is needed unless otherwise documented below in the visit note. 

## 2016-12-20 NOTE — Patient Instructions (Signed)
GO TO THE LAB : Get the blood work     GO TO THE FRONT DESK Schedule your next appointment for a  physical exam by 04-2017  =====================  Rest, fluids , tylenol  For cough:  Take Mucinex DM twice a day as needed until better  For nasal congestion: Use OTC   Flonase : 2 nasal sprays on each side of the nose in the morning until you feel better Use ASTELIN a prescribed spray : 2 nasal sprays on each side of the nose at night until you feel better   Avoid decongestants such as  Pseudoephedrine or phenylephrine   Go back on Dulera  Use albuterol if you are coughing or wheezing.   Call if not gradually better over the next  10 days  Call anytime if the symptoms are severe, you have high fever, short of breath, chest pain

## 2016-12-21 LAB — BASIC METABOLIC PANEL
BUN: 10 mg/dL (ref 6–23)
CHLORIDE: 102 meq/L (ref 96–112)
CO2: 30 meq/L (ref 19–32)
CREATININE: 0.84 mg/dL (ref 0.40–1.20)
Calcium: 9.3 mg/dL (ref 8.4–10.5)
GFR: 94.61 mL/min (ref >60.00)
Glucose, Bld: 83 mg/dL (ref 70–99)
Potassium: 4.1 mEq/L (ref 3.5–5.1)
SODIUM: 140 meq/L (ref 135–145)

## 2016-12-21 LAB — MICROALBUMIN / CREATININE URINE RATIO
Creatinine,U: 129.3 mg/dL
Microalb Creat Ratio: 0.5 mg/g (ref 0.0–30.0)

## 2016-12-21 LAB — HEMOGLOBIN A1C: HEMOGLOBIN A1C: 6.3 % (ref 4.6–6.5)

## 2016-12-21 MED ORDER — FLUTICASONE FUROATE-VILANTEROL 100-25 MCG/INH IN AEPB: 1.0000 | INHALATION_SPRAY | Freq: Every day | RESPIRATORY_TRACT | 6 refills | Status: DC

## 2016-12-21 MED FILL — BREO ELLIPTA 100-25 MCG INH: 100-25 | 30 days supply | Qty: 60 | Fill #0

## 2016-12-21 NOTE — Assessment & Plan Note (Signed)
URI: Conservative treatment and also refill her albuterol, Dulera, Astelin. If not better she will let me know Asthma: RF her medication as above but otherwise rec  to see her allergies Hyperlipidemia: Tried briefly Lipitor but developed stomach pain and stopped. Morbid obesity: We got SAXENDA for her, she tried, had s/e (headaches) and stopped. DM: Diet control, check a BMP and A1c RTC 04-2017, CPX

## 2016-12-21 NOTE — Telephone Encounter (Signed)
Breo Ellipta 100-25 or 200-25?

## 2016-12-21 NOTE — Telephone Encounter (Addendum)
Failed to Symbicort before. If she is willing to try, send BreoEllipta

## 2016-12-21 NOTE — Telephone Encounter (Signed)
100-25 

## 2016-12-21 NOTE — Telephone Encounter (Signed)
Rx sent 

## 2016-12-24 ENCOUNTER — Telehealth: Payer: Self-pay | Admitting: Internal Medicine

## 2016-12-24 MED ORDER — HYDROCODONE-HOMATROPINE 5-1.5 MG/5ML PO SYRP: 5.0000 mL | ORAL_SOLUTION | Freq: Every evening | ORAL | 0 refills | Status: DC | PRN

## 2016-12-24 MED ORDER — AZITHROMYCIN 250 MG PO TABS: ORAL_TABLET | ORAL | 0 refills | Status: DC

## 2016-12-24 MED FILL — HYDROCODONE-HOMATROPINE SYR: 5-1.5 | 24 days supply | Qty: 120 | Fill #0

## 2016-12-24 NOTE — Telephone Encounter (Signed)
Pt called in because she was seen and is still not feeling better. Pt would like to be advised further. She would like to know if provider could send her something in to pharmacy:    Pharmacy: [facility] on [address]: [phone]

## 2016-12-24 NOTE — Telephone Encounter (Signed)
Was seen with URI, if she is not feeling better despite taking Mucinex, inhaler some nasal sprays, still has nasal congestion and yellow discharge. Unable to sleep due to cough. Plan: Z-Pak Will pick up a prescription for hydrocodone syrup, she is allergic to oxycodone but has taken hydrocodone multiple times without problems.

## 2016-12-24 NOTE — Telephone Encounter (Signed)
Please advise 

## 2017-01-10 ENCOUNTER — Ambulatory Visit (INDEPENDENT_AMBULATORY_CARE_PROVIDER_SITE_OTHER): Payer: BC Managed Care – PPO | Admitting: Family Medicine

## 2017-01-10 ENCOUNTER — Encounter: Payer: Self-pay | Admitting: Family Medicine

## 2017-01-10 DIAGNOSIS — M25561 Pain in right knee: Secondary | ICD-10-CM | POA: Diagnosis not present

## 2017-01-10 DIAGNOSIS — G8929 Other chronic pain: Secondary | ICD-10-CM

## 2017-01-10 MED ORDER — DICLOFENAC SODIUM 75 MG PO TBEC: 75.0000 mg | DELAYED_RELEASE_TABLET | Freq: Two times a day (BID) | ORAL | 1 refills | Status: DC

## 2017-01-10 NOTE — Patient Instructions (Addendum)
Your pain is due to arthritis.Patient report These are the different medications you can take for this: Tylenol 500mg  1-2 tabs three times a day for pain. Capsaicin, aspercreme, or biofreeze topically up to four times a day may also help with pain. Some supplements that may help for arthritis: Boswellia extract, curcumin, pycnogenol Diclofenac 75mg  twice a day with food for pain and inflammation. Cortisone injections are an option. If cortisone injections do not help, there are different types of shots that may help but they take longer to take effect. It's important that you continue to stay active. Straight leg raises, knee extensions 3 sets of 10 once a day (add ankle weight if these become too easy). Consider physical therapy to strengthen muscles around the joint that hurts to take pressure off of the joint itself. Shoe inserts with good arch support may be helpful. Ice 15 minutes at a time 3-4 times a day as needed to help with pain. Consider compression sleeve to help with swelling. Water aerobics and cycling with low resistance are the best two types of exercise for arthritis though any exercise is ok as long as it doesn't worsen the pain. Follow up with me in 1 month.

## 2017-01-12 DIAGNOSIS — M25561 Pain in right knee: Secondary | ICD-10-CM | POA: Insufficient documentation

## 2017-01-12 NOTE — Progress Notes (Signed)
PCP: Colon Branch, MD  Subjective:   HPI: Patient is a 46 y.o. female here for right knee pain, swelling.  Patient reports for about a month she's had pain, soreness, swelling anterior right knee. No new injury or trauma. She had an accident 3 years ago requiring PT, cane, crutches, and was limping but records do not indicate a right knee injury with that accident. She reports current issue pain gets up to 8/10. Tried tylenol, gets a monthly massage. No skin changes, numbness.  Past Medical History:  Diagnosis Date  . Abnormal brain MRI   . Allergic rhinitis 05/21/2013  . Arthritis   . Asthma   . Chronic kidney disease   . Depression    h/o   . Diabetes ([place]) 04/03/2013  . Elevated lipids   . GERD (gastroesophageal reflux disease)   . Infertility, female    h/o  . Insomnia   . Obesity   . PFO (patent foramen ovale)    ??PFO repair:  in the 90s, was seen several times after and released (per patient)    Current Outpatient Prescriptions on File Prior to Visit  Medication Sig Dispense Refill  . acetaminophen (TYLENOL) 500 MG tablet Take 1,000 mg by mouth every 6 (six) hours as needed.    Marland Kitchen albuterol (PROVENTIL) (2.5 MG/3ML) 0.083% nebulizer solution Take 3 mLs (2.5 mg total) by nebulization every 6 (six) hours as needed for wheezing or shortness of breath. 150 mL 5  . albuterol (VENTOLIN HFA) 108 (90 Base) MCG/ACT inhaler Inhale 1 puff into the lungs every 6 (six) hours as needed for wheezing or shortness of breath. 18 g 5  . azelastine (ASTELIN) 0.1 % nasal spray Place 2 sprays into both nostrils at bedtime as needed for rhinitis. Use in each nostril as directed 30 mL 5  . azithromycin (ZITHROMAX Z-PAK) 250 MG tablet 2 tabs a day the first day, then 1 tab a day x 4 days 6 tablet 0  . famotidine (PEPCID) 20 MG tablet Take 1 tablet (20 mg total) by mouth 2 (two) times daily. 30 tablet 0  . fluticasone (FLONASE) 50 MCG/ACT nasal spray Place 2 sprays into both nostrils daily.  (Patient not taking: Reported on 06/14/2016) 16 g 1  . fluticasone furoate-vilanterol (BREO ELLIPTA) 100-25 MCG/INH AEPB Inhale 1 puff into the lungs daily. 28 each 6  . HYDROcodone-homatropine (HYCODAN) 5-1.5 MG/5ML syrup Take 5 mLs by mouth at bedtime as needed for cough. 120 mL 0  . Multiple Vitamin (MULTIVITAMIN WITH MINERALS) TABS tablet Take 1 tablet by mouth daily. ALIVE     No current facility-administered medications on file prior to visit.     Past Surgical History:  Procedure Laterality Date  . closed hearty valve repair  [place] , PFO repair??    Allergies  Allergen Reactions  . Contrave [Naltrexone-Bupropion Hcl Er]     Multiple s/e, hot flashes, back pain, lethargic  . Gadolinium Derivatives Itching and Rash    Worsening symptoms 30 min after oral benadryl.  Pt was referred to ED by Dr. Anselm Pancoast.  . Metformin And Related     Generalize itching, rash (arms)  . Oxycodone-Acetaminophen Nausea And Vomiting  . Penicillins Hives    Has patient had a PCN reaction causing immediate rash, facial/tongue/throat swelling, SOB or lightheadedness with hypotension:unsure Has patient had a PCN reaction causing severe rash involving mucus membranes or skin necrosis:unsure Has patient had a PCN reaction that required hospitalization:No Has patient had a  PCN reaction occurring within the last 10 years:No If all of the above answers are "NO", then may proceed with Cephalosporin use. Childhood reaction  . Contrast Media [Iodinated Diagnostic Agents] Itching and Rash    Social History   Social History  . Marital status: Married    Spouse name: N/A  . Number of children: 0  . Years of education: N/A   Occupational History  . school teacher -support    Social History Main Topics  . Smoking status: Never Smoker  . Smokeless tobacco: Never Used  . Alcohol use Yes     Comment: socially   . Drug use: No  . Sexual activity: Not on file   Other Topics Concern  . Not on file    Social History Narrative   mother is Elodia Florence, one of my patients    Lives w/ husband    Family History  Problem Relation Age of Onset  . Heart attack Father 57  . Diabetes Mother   . Hypertension Mother        M, F  . Prostate cancer Maternal Grandfather   . Cancer Maternal Grandfather        late in life   . Breast cancer Neg Hx   . Colon cancer Neg Hx     BP 139/72   Pulse 66   Ht 5\' 5"  (1.651 m)   Wt 300 lb (136.1 kg)   BMI 49.92 kg/m   Review of Systems: See HPI above.     Objective:  Physical Exam:  Gen: NAD, obese, comfortable in exam room  Right knee: No gross deformity, ecchymoses.  ? effusion. TTP mildly medial and lateral joint lines.  No other tenderness. FROM. Negative ant/post drawers. Negative valgus/varus testing. Negative lachmanns. Negative mcmurrays, apleys, patellar apprehension. NV intact distally.  Left knee: FROM without pain.   MSK u/s confirmed mod-large effusion.  Assessment & Plan:  1. Right knee pain - consistent with arthritis leading to knee effusion, pain.  Rest of exam reassuring.  Diclofenac twice a day.  Tylenol, topical medications, supplements reviewed.  Shown home exercises to do daily.  Consider injection if not improving.  F/u in 1 month.

## 2017-01-12 NOTE — Assessment & Plan Note (Signed)
consistent with arthritis leading to knee effusion, pain.  Rest of exam reassuring.  Diclofenac twice a day.  Tylenol, topical medications, supplements reviewed.  Shown home exercises to do daily.  Consider injection if not improving.  F/u in 1 month.

## 2017-02-11 ENCOUNTER — Ambulatory Visit: Payer: BC Managed Care – PPO | Admitting: Family Medicine

## 2017-02-14 ENCOUNTER — Ambulatory Visit (INDEPENDENT_AMBULATORY_CARE_PROVIDER_SITE_OTHER): Payer: BC Managed Care – PPO | Admitting: Family Medicine

## 2017-02-14 ENCOUNTER — Encounter: Payer: Self-pay | Admitting: Family Medicine

## 2017-02-14 DIAGNOSIS — G8929 Other chronic pain: Secondary | ICD-10-CM

## 2017-02-14 DIAGNOSIS — M25561 Pain in right knee: Secondary | ICD-10-CM

## 2017-02-14 MED ORDER — METHYLPREDNISOLONE ACETATE 40 MG/ML IJ SUSP
40.0000 mg | Freq: Once | INTRAMUSCULAR | Status: AC
  Administered 2017-02-14: 40 mg via INTRA_ARTICULAR

## 2017-02-14 NOTE — Patient Instructions (Signed)
Your pain is due to arthritis vs meniscus tear. These are the different medications you can take for this: Tylenol 500mg  1-2 tabs three times a day for pain. Capsaicin, aspercreme, or biofreeze topically up to four times a day may also help with pain. Some supplements that may help for arthritis: Boswellia extract, curcumin, pycnogenol Diclofenac 75mg  twice a day with food for pain and inflammation. Cortisone injections are an option - you were given this today. Let me know how you're doing in 1-2 weeks. If improving we can consider adding physical therapy. If you're struggling we will go ahead with an MRI.

## 2017-02-16 ENCOUNTER — Encounter: Payer: Self-pay | Admitting: Family Medicine

## 2017-02-16 NOTE — Progress Notes (Signed)
PCP: Colon Branch, MD  Subjective:   HPI: Patient is a 46 y.o. female here for right knee pain, swelling.  10/1: Patient reports for about a month she's had pain, soreness, swelling anterior right knee. No new injury or trauma. She had an accident 3 years ago requiring PT, cane, crutches, and was limping but records do not indicate a right knee injury with that accident. She reports current issue pain gets up to 8/10. Tried tylenol, gets a monthly massage. No skin changes, numbness.  11/5: Patient reports her knee continues to give her a lot of trouble. Pain level 7-8/10, sharp anteromedial right knee. Buckled last week. Associated swelling. Pain worse with standing, sitting, sleeping, walking. Diclofenac helped initially. Has been icing, soaking. Afraid to do exercises due to pain. No skin changes, numbness.  Past Medical History:  Diagnosis Date  . Abnormal brain MRI   . Allergic rhinitis 05/21/2013  . Arthritis   . Asthma   . Chronic kidney disease   . Depression    h/o   . Diabetes ([place]) 04/03/2013  . Elevated lipids   . GERD (gastroesophageal reflux disease)   . Infertility, female    h/o  . Insomnia   . Obesity   . PFO (patent foramen ovale)    ??PFO repair:  in the 90s, was seen several times after and released (per patient)    Current Outpatient Medications on File Prior to Visit  Medication Sig Dispense Refill  . acetaminophen (TYLENOL) 500 MG tablet Take 1,000 mg by mouth every 6 (six) hours as needed.    Marland Kitchen albuterol (PROVENTIL) (2.5 MG/3ML) 0.083% nebulizer solution Take 3 mLs (2.5 mg total) by nebulization every 6 (six) hours as needed for wheezing or shortness of breath. 150 mL 5  . albuterol (VENTOLIN HFA) 108 (90 Base) MCG/ACT inhaler Inhale 1 puff into the lungs every 6 (six) hours as needed for wheezing or shortness of breath. 18 g 5  . azelastine (ASTELIN) 0.1 % nasal spray Place 2 sprays into both nostrils at bedtime as needed for rhinitis. Use in  each nostril as directed 30 mL 5  . diclofenac (VOLTAREN) 75 MG EC tablet Take 1 tablet (75 mg total) by mouth 2 (two) times daily. 60 tablet 1  . famotidine (PEPCID) 20 MG tablet Take 1 tablet (20 mg total) by mouth 2 (two) times daily. 30 tablet 0  . fluticasone (FLONASE) 50 MCG/ACT nasal spray Place 2 sprays into both nostrils daily. (Patient not taking: Reported on 06/14/2016) 16 g 1  . fluticasone furoate-vilanterol (BREO ELLIPTA) 100-25 MCG/INH AEPB Inhale 1 puff into the lungs daily. 28 each 6  . Multiple Vitamin (MULTIVITAMIN WITH MINERALS) TABS tablet Take 1 tablet by mouth daily. ALIVE     No current facility-administered medications on file prior to visit.     Past Surgical History:  Procedure Laterality Date  . closed hearty valve repair  [place] , PFO repair??    Allergies  Allergen Reactions  . Contrave [Naltrexone-Bupropion Hcl Er]     Multiple s/e, hot flashes, back pain, lethargic  . Gadolinium Derivatives Itching and Rash    Worsening symptoms 30 min after oral benadryl.  Pt was referred to ED by Dr. Anselm Pancoast.  . Metformin And Related     Generalize itching, rash (arms)  . Oxycodone-Acetaminophen Nausea And Vomiting  . Penicillins Hives    Has patient had a PCN reaction causing immediate rash, facial/tongue/throat swelling, SOB or lightheadedness with hypotension:unsure  Has patient had a PCN reaction causing severe rash involving mucus membranes or skin necrosis:unsure Has patient had a PCN reaction that required hospitalization:No Has patient had a PCN reaction occurring within the last 10 years:No If all of the above answers are "NO", then may proceed with Cephalosporin use. Childhood reaction  . Contrast Media [Iodinated Diagnostic Agents] Itching and Rash    Social History   Socioeconomic History  . Marital status: Married    Spouse name: Not on file  . Number of children: 0  . Years of education: Not on file  . Highest education level: Not on file   Social Needs  . Financial resource strain: Not on file  . Food insecurity - worry: Not on file  . Food insecurity - inability: Not on file  . Transportation needs - medical: Not on file  . Transportation needs - non-medical: Not on file  Occupational History  . Occupation: Education officer, museum -support  Tobacco Use  . Smoking status: Never Smoker  . Smokeless tobacco: Never Used  Substance and Sexual Activity  . Alcohol use: Yes    Comment: socially   . Drug use: No  . Sexual activity: Not on file  Other Topics Concern  . Not on file  Social History Narrative   mother is Elodia Florence, one of my patients    Lives w/ husband    Family History  Problem Relation Age of Onset  . Heart attack Father 26  . Diabetes Mother   . Hypertension Mother        M, F  . Prostate cancer Maternal Grandfather   . Cancer Maternal Grandfather        late in life   . Breast cancer Neg Hx   . Colon cancer Neg Hx     BP (!) 152/75   Pulse (!) 56   Ht 5\' 5"  (1.651 m)   Wt 300 lb (136.1 kg)   BMI 49.92 kg/m   Review of Systems: See HPI above.     Objective:  Physical Exam:  Gen: NAD, comfortable in exam room.  Right knee: No gross deformity, ecchymoses. ?effusion. TTP medial > lateral joint lines.  No other tenderness. FROM. Negative ant/post drawers. Negative valgus/varus testing. Negative lachmanns. Negative mcmurrays, apleys, patellar apprehension. NV intact distally.  Left knee: No gross deformity. FROM with 5/5 strength. Negative ant/post drawer. NVI distally.  Assessment & Plan:  1. Right knee pain - consistent with flare of arthritis vs meniscus tear.  Not improving with diclofenac and afraid to do home exercises due to pain.  Intraarticular injection given today.  Tylenol, capsaicin, supplements reviewed.  Diclofenac as needed.  Let us know how she's doing over next 1-2 weeks - if not improving will go ahead with MRI.  After informed written consent timeout was  performed, patient was seated on exam table. Right knee was prepped with alcohol swab and utilizing anteromedial approach, patient's right knee was injected intraarticularly with 3:1 bupivicaine: depomedrol. Patient tolerated the procedure well without immediate complications.

## 2017-02-16 NOTE — Assessment & Plan Note (Signed)
consistent with flare of arthritis vs meniscus tear.  Not improving with diclofenac and afraid to do home exercises due to pain.  Intraarticular injection given today.  Tylenol, capsaicin, supplements reviewed.  Diclofenac as needed.  Let us know how she's doing over next 1-2 weeks - if not improving will go ahead with MRI.  After informed written consent timeout was performed, patient was seated on exam table. Right knee was prepped with alcohol swab and utilizing anteromedial approach, patient's right knee was injected intraarticularly with 3:1 bupivicaine: depomedrol. Patient tolerated the procedure well without immediate complications.

## 2017-02-18 LAB — HM MAMMOGRAPHY

## 2017-02-22 ENCOUNTER — Encounter: Payer: Self-pay | Admitting: Internal Medicine

## 2017-04-15 ENCOUNTER — Encounter: Payer: Self-pay | Admitting: Medical

## 2017-04-15 ENCOUNTER — Ambulatory Visit: Payer: BC Managed Care – PPO | Admitting: Medical

## 2017-04-15 VITALS — BP 125/60 | HR 58 | Temp 98.0°F | Resp 16 | Ht 65.0 in | Wt 301.2 lb

## 2017-04-15 DIAGNOSIS — R062 Wheezing: Secondary | ICD-10-CM | POA: Diagnosis not present

## 2017-04-15 DIAGNOSIS — R059 Cough, unspecified: Secondary | ICD-10-CM

## 2017-04-15 DIAGNOSIS — R05 Cough: Secondary | ICD-10-CM | POA: Diagnosis not present

## 2017-04-15 DIAGNOSIS — J0100 Acute maxillary sinusitis, unspecified: Secondary | ICD-10-CM | POA: Diagnosis not present

## 2017-04-15 MED ORDER — HYDROCODONE-HOMATROPINE 5-1.5 MG/5ML PO SYRP: 5.0000 mL | ORAL_SOLUTION | Freq: Three times a day (TID) | ORAL | 0 refills | Status: DC | PRN

## 2017-04-15 MED ORDER — AZITHROMYCIN 250 MG PO TABS: ORAL_TABLET | ORAL | 0 refills | Status: DC

## 2017-04-15 MED ORDER — PREDNISONE 10 MG PO TABS: ORAL_TABLET | ORAL | 0 refills | Status: DC

## 2017-04-15 MED ORDER — FLUTICASONE PROPIONATE 50 MCG/ACT NA SUSP: 2.0000 | Freq: Every day | NASAL | 1 refills | Status: DC

## 2017-04-15 MED FILL — FLUTICASONE PROP 50 MCG SPR: 50 | 30 days supply | Qty: 16 | Fill #0

## 2017-04-15 MED FILL — AZITHROMYCIN 250 MG TABLET: 250 | 5 days supply | Qty: 6 | Fill #0

## 2017-04-15 MED FILL — HYDROCODONE-HOMATROPINE SOL: 5-1.5 | 6 days supply | Qty: 100 | Fill #0

## 2017-04-15 NOTE — Patient Instructions (Addendum)
You  appear to have a sinus infection. I am prescribing azithromycin  antibiotic for the infection. To help with the nasal congestion I prescribed nasal steroid flonase. For your associated cough, I prescribed cough medicine hycodan  For wheezing use your neb tx. But if using frequently then start prednisone taper. Rx advisement given.  Rest, hydrate, tylenol for fever.  Follow up in 7 days or as needed.

## 2017-04-15 NOTE — Progress Notes (Signed)
   Subjective:    Patient ID: Lauren Johns, female    DOB: 1970-10-29, 47 y.o.   MRN: [id number]  HPI   Pt states 4 days ago mild st and ear pain. Since then some nasal congestion, chest congestion, sinus pressure and left side lymph node mild swollen.  Pt states started to wheeze about 2 days ago.  Pt was traveling and did not have her machine with her. Using neb treatment one time in evening. No family or friends sick before she got sick.     Review of Systems  Constitutional: Negative for chills, fatigue and fever.  HENT: Positive for congestion, sinus pressure, sinus pain and sore throat. Negative for trouble swallowing and voice change.   Respiratory: Positive for cough and wheezing.   Cardiovascular: Negative for chest pain.  Gastrointestinal: Negative for abdominal pain, blood in stool, nausea and vomiting.  Musculoskeletal: Negative for back pain and myalgias.  Skin: Negative for rash.  Hematological: Positive for adenopathy. Does not bruise/bleed easily.  Psychiatric/Behavioral: Negative for behavioral problems and confusion.       Objective:   Physical Exam  General  Mental Status - Alert. General Appearance - Well groomed. Not in acute distress.  Skin Rashes- No Rashes.  HEENT Head- Normal. Ear Auditory Canal - Left- Normal. Right - Normal.Tympanic Membrane- Left- Normal. Right- Normal. Eye Sclera/Conjunctiva- Left- Normal. Right- Normal. Nose & Sinuses Nasal Mucosa- Left-  Boggy and Congested. Right-  Boggy and  Congested.Bilateral maxillary and frontal sinus pressure. Mouth & Throat Lips: Upper Lip- Normal: no dryness, cracking, pallor, cyanosis, or vesicular eruption. Lower Lip-Normal: no dryness, cracking, pallor, cyanosis or vesicular eruption. Buccal Mucosa- Bilateral- No Aphthous ulcers. Oropharynx- No Discharge or Erythema. Tonsils: Characteristics- Bilateral- mild  Erythema or Congestion. Size/Enlargement- Bilateral- No enlargement. Discharge-  bilateral-None.    Neck Neck- Supple. No Masses.   Chest and Lung Exam Auscultation: Breath Sounds:-Clear even and unlabored.  Cardiovascular Auscultation:Rythm- Regular, rate and rhythm. Murmurs & Other Heart Sounds:Ausculatation of the heart reveal- No Murmurs.  Lymphatic Head & Neck General Head & Neck Lymphatics: Bilateral: Description- mild faint enlarged and tender left submandibular lymph node.       Assessment & Plan:  You  appear to have a sinus infection. I am prescribing azithromycin  antibiotic for the infection. To help with the nasal congestion I prescribed nasal steroid flonase. For your associated cough, I prescribed cough medicine hycodan  Advised pt azithromcycin has throat coverage as well.  For wheezing use your neb tx. But if using frequently then start prednisone taper. Rx advisment given.  Rest, hydrate, tylenol for fever.  Follow up in 7 days or as needed.

## 2017-04-21 ENCOUNTER — Telehealth: Payer: Self-pay | Admitting: Family Medicine

## 2017-04-21 DIAGNOSIS — G8929 Other chronic pain: Secondary | ICD-10-CM

## 2017-04-21 DIAGNOSIS — M25561 Pain in right knee: Principal | ICD-10-CM

## 2017-04-21 NOTE — Telephone Encounter (Signed)
Patient calling to follow up from injection in right knee that took place about a month ago. Patient states her knee was feeling fine for awhile but started to swell and become painful about a week ago

## 2017-04-22 NOTE — Telephone Encounter (Signed)
That's definitely not as much benefit as we'd hope for.  I would recommend she get x-rays of her knee as the next step downstairs - depending on those we may consider an MRI.  Is she ok coming in downstairs to get x-rays at her convenience?  If so I can put the order in.  Thanks!

## 2017-04-22 NOTE — Telephone Encounter (Signed)
Patient was informed. She would like to go ahead with x-ray. States she probably won't be able to get it done until next Wednesday due to her schedule.

## 2017-04-22 NOTE — Telephone Encounter (Signed)
Ok - order placed for x-rays.

## 2017-04-27 ENCOUNTER — Other Ambulatory Visit (HOSPITAL_BASED_OUTPATIENT_CLINIC_OR_DEPARTMENT_OTHER): Payer: BC Managed Care – PPO

## 2017-05-04 MED FILL — BREO ELLIPTA 100-25 MCG INH: 100-25 | 30 days supply | Qty: 60 | Fill #1

## 2017-05-06 ENCOUNTER — Encounter (HOSPITAL_BASED_OUTPATIENT_CLINIC_OR_DEPARTMENT_OTHER): Payer: Self-pay | Admitting: Radiology

## 2017-05-06 ENCOUNTER — Ambulatory Visit (HOSPITAL_BASED_OUTPATIENT_CLINIC_OR_DEPARTMENT_OTHER)
Admission: RE | Admit: 2017-05-06 | Discharge: 2017-05-06 | Disposition: A | Discharge status: Home / Self Care | Payer: BC Managed Care – PPO | Source: Ambulatory Visit | Attending: Family Medicine | Admitting: Family Medicine

## 2017-05-06 DIAGNOSIS — M25561 Pain in right knee: Secondary | ICD-10-CM | POA: Insufficient documentation

## 2017-05-06 DIAGNOSIS — G8929 Other chronic pain: Secondary | ICD-10-CM | POA: Diagnosis not present

## 2017-05-09 NOTE — Addendum Note (Signed)
Addended by: Sherrie George F on: 05/09/2017 04:26 PM   Modules accepted: Orders

## 2017-05-24 ENCOUNTER — Ambulatory Visit: Payer: BC Managed Care – PPO | Admitting: Physical Therapy

## 2017-05-24 ENCOUNTER — Encounter: Payer: Self-pay | Admitting: Physical Therapy

## 2017-05-24 DIAGNOSIS — M79671 Pain in right foot: Secondary | ICD-10-CM

## 2017-05-24 DIAGNOSIS — M79672 Pain in left foot: Secondary | ICD-10-CM | POA: Diagnosis not present

## 2017-05-24 DIAGNOSIS — M25561 Pain in right knee: Secondary | ICD-10-CM

## 2017-05-24 DIAGNOSIS — R2689 Other abnormalities of gait and mobility: Secondary | ICD-10-CM | POA: Diagnosis not present

## 2017-05-24 DIAGNOSIS — G8929 Other chronic pain: Secondary | ICD-10-CM
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
## 2017-06-15 ENCOUNTER — Encounter: Payer: Self-pay | Admitting: Physical Therapy

## 2017-06-15 ENCOUNTER — Ambulatory Visit: Payer: BC Managed Care – PPO | Admitting: Physical Therapy

## 2017-06-15 DIAGNOSIS — R2689 Other abnormalities of gait and mobility: Secondary | ICD-10-CM | POA: Diagnosis not present

## 2017-06-15 DIAGNOSIS — G8929 Other chronic pain: Secondary | ICD-10-CM | POA: Diagnosis not present

## 2017-06-15 DIAGNOSIS — M79672 Pain in left foot: Secondary | ICD-10-CM | POA: Diagnosis not present

## 2017-06-15 DIAGNOSIS — M79671 Pain in right foot: Secondary | ICD-10-CM

## 2017-06-15 DIAGNOSIS — M25561 Pain in right knee: Secondary | ICD-10-CM | POA: Diagnosis not present

## 2017-06-15 NOTE — Therapy (Signed)
[facility] [address] [place], [place], [address] Phone: [phone]   Fax:  [phone]  Physical Therapy Treatment  Patient Details  Name: Lauren Johns MRN: [id number] Date of Birth: May 11, 1970 No Data Recorded  Encounter Date: 06/15/2017  PT End of Session - 06/15/17 1611    Visit Number  2    Number of Visits  12    Date for PT Re-Evaluation  07/05/17    Authorization Type  [employer]BS    PT Start Time  1600    PT Stop Time  1645    PT Time Calculation (min)  45 min    Activity Tolerance  Patient tolerated treatment well;Patient limited by pain    Behavior During Therapy  [facility] for tasks assessed/performed       Past Medical History:  Diagnosis Date  . Abnormal brain MRI   . Allergic rhinitis 05/21/2013  . Arthritis   . Asthma   . Chronic kidney disease   . Depression    h/o   . Diabetes ([place]) 04/03/2013  . Elevated lipids   . GERD (gastroesophageal reflux disease)   . Infertility, female    h/o  . Insomnia   . Obesity   . PFO (patent foramen ovale)    ??PFO repair:  in the 90s, was seen several times after and released (per patient)    Past Surgical History:  Procedure Laterality Date  . closed hearty valve repair  [place] , PFO repair??    There were no vitals filed for this visit.  Subjective Assessment - 06/15/17 1609    Subjective  Pt states that she has not been doing HEP. She is wearing sneakers vs flats, states that this has helped her knee and foot pain. She feels that knee is not as stiff.     Currently in Pain?  Yes    Pain Score  5     Pain Location  Knee    Pain Orientation  Right    Pain Descriptors / Indicators  Tightness;Sore;Dull    Pain Type  Chronic pain    Pain Onset  More than a month ago    Pain Frequency  Constant                      [facility] Adult PT Treatment/Exercise - 06/15/17 1619      Ambulation/Gait   Gait Comments  35 ft x10 with practice for heel to toe  pattern       Posture/Postural Control   Posture Comments  --      Exercises   Exercises  Knee/Hip      Knee/Hip Exercises: Stretches   Active Hamstring Stretch  3 reps;30 seconds    Active Hamstring Stretch Limitations  seated      Knee/Hip Exercises: Aerobic   Stationary Bike  L2 x 8 min      Knee/Hip Exercises: Standing   Heel Raises  20 reps    Forward Step Up  10 reps;Both      Knee/Hip Exercises: Seated   Long Arc Quad  20 reps    Hamstring Curl  20 reps;Both    Hamstring Limitations  RTB      Knee/Hip Exercises: Supine   Quad Sets  20 reps    Heel Slides  20 reps    Heel Slides Limitations  10 with strap/ 10 no strap    Straight Leg Raises  15 reps;Both  Knee/Hip Exercises: Sidelying   Hip ABduction  15 reps;Both             PT Education - 06/15/17 1610    Education provided  Yes    Education Details  HEP     Person(s) Educated  Patient    Methods  Explanation    Comprehension  Need further instruction       PT Short Term Goals - 05/25/17 1005      PT SHORT TERM GOAL #1   Title  Pt to be independent with initial HEP for ROM of knee.     Time  2    Period  Weeks    Status  New    Target Date  06/07/17      PT SHORT TERM GOAL #2   Title  Pt to report decreased pain to 4/10 with activity     Time  2    Period  Weeks    Status  New    Target Date  06/07/17        PT Long Term Goals - 05/25/17 1006      PT LONG TERM GOAL #1   Title  Pt to demo improved ROM of R knee, to be equal to L, to improve ability for transfers and ambulation    Time  6    Period  Weeks    Status  New    Target Date  07/05/17      PT LONG TERM GOAL #2   Title  Pt to demo increased strength of R knee and hip to be at least 4+/5, to improve stability and ambulation     Time  6    Period  Weeks    Status  New    Target Date  07/05/17      PT LONG TERM GOAL #3   Title  Pt to report decreased pain in R knee, to 0-2/10 with standing and walking activity, to  improve ability for work duties and community navigation     Time  6    Period  Weeks    Status  New    Target Date  07/05/17      PT LONG TERM GOAL #4   Title  Pt to be compliant with proper footwear (for her foot type) and/or orthotics, to improve alignment , pain, and ability for ambulation     Time  6    Period  Weeks    Status  New    Target Date  07/05/17            Plan - 06/15/17 1650    Clinical Impression Statement  Pt with improved ROM for R knee extension today. HEP reviewed, discussed importance of doing daily for ROM and strength. Pt educated on heel to toe pattern with gait. Recommended that pt obtain footwear/sneakers that are appropriate for her foot type.     Rehab Potential  Good    PT Frequency  2x / week    PT Duration  6 weeks    PT Treatment/Interventions  ADLs/Self Care Home Management;Cryotherapy;Iontophoresis 4mg /ml Dexamethasone;Functional mobility training;Stair training;Electrical Stimulation;Gait training;DME Instruction;Ultrasound;Moist Heat;Therapeutic activities;Therapeutic exercise;Balance training;Neuromuscular re-education;Patient/family education;Orthotic Fit/Training;Passive range of motion;Manual techniques;Dry needling;Taping;Vasopneumatic Device    PT Next Visit Plan  ROM and strength     Consulted and Agree with Plan of Care  Patient       Patient will benefit from skilled therapeutic intervention in order to improve the following deficits and  impairments:  Abnormal gait, Decreased range of motion, Difficulty walking, Decreased endurance, Decreased activity tolerance, Pain, Impaired flexibility, Hypomobility, Decreased balance, Decreased strength, Increased edema  Visit Diagnosis: Chronic pain of right knee  Pain in left foot  Pain in right foot  Other abnormalities of gait and mobility     Problem List Patient Active Problem List   Diagnosis Date Noted  . Right knee pain 01/12/2017  . Internal hemorrhoids 05/14/2015  . PCP  notes ------------ 12/25/2014  . Abnormal brain MRI-- pituitary adenoma  03/20/2014  . Elevated lipids 03/20/2014  . Allergic rhinitis 05/21/2013  . Headache, migraine 05/21/2013  . Diabetes ([place]) 04/03/2013  . Dyspnea on exertion 06/29/2012  . PFO (patent foramen ovale) 10/11/2011  . Acne 10/11/2011  . Annual physical exam 10/26/2010  . GERD 09/11/2008  . DEPRESSION 04/20/2007  . INSOMNIA-SLEEP DISORDER-UNSPEC 04/19/2007  . Morbid obesity ([place]) 12/13/2006  . Asthma, chronic 12/13/2006   Lyndee Hensen, PT, DPT 4:51 PM  06/15/17    [facility] [place], [place], [address] Phone: [phone]   Fax:  [phone]  Name: Lauren Johns MRN: [id number] Date of Birth: [birth year]

## 2017-06-16 ENCOUNTER — Encounter: Payer: Self-pay | Admitting: Physical Therapy

## 2017-06-16 ENCOUNTER — Ambulatory Visit: Payer: BC Managed Care – PPO | Admitting: Physical Therapy

## 2017-06-16 DIAGNOSIS — M79671 Pain in right foot: Secondary | ICD-10-CM | POA: Diagnosis not present

## 2017-06-16 DIAGNOSIS — M79672 Pain in left foot: Secondary | ICD-10-CM

## 2017-06-16 DIAGNOSIS — G8929 Other chronic pain: Secondary | ICD-10-CM

## 2017-06-16 DIAGNOSIS — M25561 Pain in right knee: Secondary | ICD-10-CM

## 2017-06-16 DIAGNOSIS — R2689 Other abnormalities of gait and mobility: Secondary | ICD-10-CM | POA: Diagnosis not present

## 2017-06-16 NOTE — Therapy (Signed)
[facility] [address] [place], [place], [address] Phone: [phone]   Fax:  [phone]  Physical Therapy Treatment  Patient Details  Name: Lauren Johns MRN: [id number] Date of Birth: May 14, 1970 No Data Recorded  Encounter Date: 06/16/2017  PT End of Session - 06/16/17 1655    Visit Number  3    Number of Visits  12    Date for PT Re-Evaluation  07/05/17    Authorization Type  BCBS    PT Start Time  1600    PT Stop Time  8315    PT Time Calculation (min)  43 min    Activity Tolerance  Patient tolerated treatment well;Patient limited by pain    Behavior During Therapy  [facility] for tasks assessed/performed       Past Medical History:  Diagnosis Date  . Abnormal brain MRI   . Allergic rhinitis 05/21/2013  . Arthritis   . Asthma   . Chronic kidney disease   . Depression    h/o   . Diabetes ([place]) 04/03/2013  . Elevated lipids   . GERD (gastroesophageal reflux disease)   . Infertility, female    h/o  . Insomnia   . Obesity   . PFO (patent foramen ovale)    ??PFO repair:  in the 90s, was seen several times after and released (per patient)    Past Surgical History:  Procedure Laterality Date  . closed hearty valve repair  [place] , PFO repair??    There were no vitals filed for this visit.  Subjective Assessment - 06/16/17 1654    Subjective  No new complaints.     Currently in Pain?  Yes    Pain Score  4     Pain Location  Knee    Pain Orientation  Right    Pain Descriptors / Indicators  Tightness;Sore    Pain Type  Chronic pain    Pain Onset  More than a month ago    Pain Frequency  Constant                      [facility] Adult PT Treatment/Exercise - 06/16/17 1617      Ambulation/Gait   Gait Comments  35 ft x8 with practice for heel to toe pattern       Exercises   Exercises  Knee/Hip      Knee/Hip Exercises: Stretches   Active Hamstring Stretch  3 reps;30 seconds    Active  Hamstring Stretch Limitations  seated      Knee/Hip Exercises: Aerobic   Stationary Bike  L2 x 8 min      Knee/Hip Exercises: Standing   Heel Raises  20 reps    Hip Flexion  20 reps;Both;Knee bent    Hip Abduction  2 sets;10 reps;Both    Forward Step Up  --    Other Standing Knee Exercises  Weight shifts on Air Ex: A/P and L/R x20 each       Knee/Hip Exercises: Seated   Long Arc Quad  20 reps    Long Arc Quad Weight  2 lbs.    Hamstring Curl  20 reps;Both    Hamstring Limitations  RTB      Knee/Hip Exercises: Supine   Quad Sets  20 reps    Short Arc [employer]  20 reps    Short Arc Quad Sets Limitations  1.5 lb     Heel Slides  --  Heel Slides Limitations  --    Straight Leg Raises  Both;20 reps      Knee/Hip Exercises: Sidelying   Hip ABduction  --             PT Education - 06/16/17 1655    Education provided  Yes    Education Details  HEP, footwear     Person(s) Educated  Patient    Methods  Explanation    Comprehension  Verbalized understanding       PT Short Term Goals - 05/25/17 1005      PT SHORT TERM GOAL #1   Title  Pt to be independent with initial HEP for ROM of knee.     Time  2    Period  Weeks    Status  New    Target Date  06/07/17      PT SHORT TERM GOAL #2   Title  Pt to report decreased pain to 4/10 with activity     Time  2    Period  Weeks    Status  New    Target Date  06/07/17        PT Long Term Goals - 05/25/17 1006      PT LONG TERM GOAL #1   Title  Pt to demo improved ROM of R knee, to be equal to L, to improve ability for transfers and ambulation    Time  6    Period  Weeks    Status  New    Target Date  07/05/17      PT LONG TERM GOAL #2   Title  Pt to demo increased strength of R knee and hip to be at least 4+/5, to improve stability and ambulation     Time  6    Period  Weeks    Status  New    Target Date  07/05/17      PT LONG TERM GOAL #3   Title  Pt to report decreased pain in R knee, to 0-2/10 with  standing and walking activity, to improve ability for work duties and community navigation     Time  6    Period  Weeks    Status  New    Target Date  07/05/17      PT LONG TERM GOAL #4   Title  Pt to be compliant with proper footwear (for her foot type) and/or orthotics, to improve alignment , pain, and ability for ambulation     Time  6    Period  Weeks    Status  New    Target Date  07/05/17            Plan - 06/16/17 1656    Clinical Impression Statement  Pt able to progress ther ex for LE strength today, without increased pain. Plan to progress as tolerated. Discussed change in footwear for increased support and decreased pain.     Rehab Potential  Good    PT Frequency  2x / week    PT Duration  6 weeks    PT Treatment/Interventions  ADLs/Self Care Home Management;Cryotherapy;Iontophoresis 4mg /ml Dexamethasone;Functional mobility training;Stair training;Electrical Stimulation;Gait training;DME Instruction;Ultrasound;Moist Heat;Therapeutic activities;Therapeutic exercise;Balance training;Neuromuscular re-education;Patient/family education;Orthotic Fit/Training;Passive range of motion;Manual techniques;Dry needling;Taping;Vasopneumatic Device    PT Next Visit Plan  ROM and strength     Consulted and Agree with Plan of Care  Patient       Patient will benefit from skilled therapeutic intervention in order to  improve the following deficits and impairments:  Abnormal gait, Decreased range of motion, Difficulty walking, Decreased endurance, Decreased activity tolerance, Pain, Impaired flexibility, Hypomobility, Decreased balance, Decreased strength, Increased edema  Visit Diagnosis: Chronic pain of right knee  Pain in left foot  Pain in right foot  Other abnormalities of gait and mobility     Problem List Patient Active Problem List   Diagnosis Date Noted  . Right knee pain 01/12/2017  . Internal hemorrhoids 05/14/2015  . PCP notes ------------ 12/25/2014  . Abnormal  brain MRI-- pituitary adenoma  03/20/2014  . Elevated lipids 03/20/2014  . Allergic rhinitis 05/21/2013  . Headache, migraine 05/21/2013  . Diabetes ([place]) 04/03/2013  . Dyspnea on exertion 06/29/2012  . PFO (patent foramen ovale) 10/11/2011  . Acne 10/11/2011  . Annual physical exam 10/26/2010  . GERD 09/11/2008  . DEPRESSION 04/20/2007  . INSOMNIA-SLEEP DISORDER-UNSPEC 04/19/2007  . Morbid obesity ([place]) 12/13/2006  . Asthma, chronic 12/13/2006   Lyndee Hensen, PT, DPT 4:57 PM  06/16/17    [facility] [place], [place], [address] Phone: [phone]   Fax:  [phone]  Name: Lauren Johns MRN: [id number] Date of Birth: [birth year]

## 2017-06-21 ENCOUNTER — Ambulatory Visit: Payer: BC Managed Care – PPO | Admitting: Physical Therapy

## 2017-06-21 DIAGNOSIS — M79671 Pain in right foot: Secondary | ICD-10-CM

## 2017-06-21 DIAGNOSIS — M25561 Pain in right knee: Secondary | ICD-10-CM | POA: Diagnosis not present

## 2017-06-21 DIAGNOSIS — R2689 Other abnormalities of gait and mobility: Secondary | ICD-10-CM | POA: Diagnosis not present

## 2017-06-21 DIAGNOSIS — M79672 Pain in left foot: Secondary | ICD-10-CM | POA: Diagnosis not present

## 2017-06-21 DIAGNOSIS — G8929 Other chronic pain: Secondary | ICD-10-CM | POA: Diagnosis not present

## 2017-06-21 NOTE — Therapy (Signed)
[facility] [address] [place], [place], [address] Phone: [phone]   Fax:  [phone]  Physical Therapy Treatment  Patient Details  Name: Lauren Johns MRN: [id number] Date of Birth: April 19, 1970 No Data Recorded  Encounter Date: 06/21/2017  PT End of Session - 06/21/17 1653    Visit Number  4    Number of Visits  12    Date for PT Re-Evaluation  07/05/17    Authorization Type  BCBS    PT Start Time  1601    PT Stop Time  1650    PT Time Calculation (min)  49 min    Activity Tolerance  Patient tolerated treatment well;Patient limited by pain    Behavior During Therapy  [facility] for tasks assessed/performed       Past Medical History:  Diagnosis Date  . Abnormal brain MRI   . Allergic rhinitis 05/21/2013  . Arthritis   . Asthma   . Chronic kidney disease   . Depression    h/o   . Diabetes ([place]) 04/03/2013  . Elevated lipids   . GERD (gastroesophageal reflux disease)   . Infertility, female    h/o  . Insomnia   . Obesity   . PFO (patent foramen ovale)    ??PFO repair:  in the 90s, was seen several times after and released (per patient)    Past Surgical History:  Procedure Laterality Date  . closed hearty valve repair  [place] , PFO repair??    There were no vitals filed for this visit.  Subjective Assessment - 06/21/17 1652    Subjective  Pt states improving soreness and stiffness.     Currently in Pain?  Yes    Pain Score  2     Pain Location  Knee    Pain Orientation  Right    Pain Descriptors / Indicators  Tightness;Sore;Dull    Pain Type  Chronic pain    Pain Onset  More than a month ago    Pain Frequency  Constant                      [facility] Adult PT Treatment/Exercise - 06/21/17 1638      Ambulation/Gait   Gait Comments  --      Exercises   Exercises  Knee/Hip      Knee/Hip Exercises: Stretches   Active Hamstring Stretch  3 reps;30 seconds    Active Hamstring Stretch  Limitations  seated      Knee/Hip Exercises: Aerobic   Stationary Bike  L2 x 8 min      Knee/Hip Exercises: Standing   Heel Raises  20 reps    Hip Flexion  20 reps;Both;Knee bent    Hip Abduction  2 sets;10 reps;Both    Forward Step Up  10 reps;Right;Step Height: 6"    Other Standing Knee Exercises  Mini Squats x10      Knee/Hip Exercises: Seated   Long Arc Quad  20 reps    Long Arc Quad Weight  2 lbs.    Hamstring Curl  20 reps;Both    Hamstring Limitations  RTB      Knee/Hip Exercises: Supine   Quad Sets  20 reps    Short Arc Quad Sets  20 reps    Straight Leg Raises  Both;20 reps      Knee/Hip Exercises: Sidelying   Hip ABduction  20 reps;Both  PT Education - 06/21/17 1653    Education provided  Yes    Education Details  HEP     Person(s) Educated  Patient    Methods  Explanation    Comprehension  Verbalized understanding       PT Short Term Goals - 05/25/17 1005      PT SHORT TERM GOAL #1   Title  Pt to be independent with initial HEP for ROM of knee.     Time  2    Period  Weeks    Status  New    Target Date  06/07/17      PT SHORT TERM GOAL #2   Title  Pt to report decreased pain to 4/10 with activity     Time  2    Period  Weeks    Status  New    Target Date  06/07/17        PT Long Term Goals - 05/25/17 1006      PT LONG TERM GOAL #1   Title  Pt to demo improved ROM of R knee, to be equal to L, to improve ability for transfers and ambulation    Time  6    Period  Weeks    Status  New    Target Date  07/05/17      PT LONG TERM GOAL #2   Title  Pt to demo increased strength of R knee and hip to be at least 4+/5, to improve stability and ambulation     Time  6    Period  Weeks    Status  New    Target Date  07/05/17      PT LONG TERM GOAL #3   Title  Pt to report decreased pain in R knee, to 0-2/10 with standing and walking activity, to improve ability for work duties and community navigation     Time  6    Period  Weeks     Status  New    Target Date  07/05/17      PT LONG TERM GOAL #4   Title  Pt to be compliant with proper footwear (for her foot type) and/or orthotics, to improve alignment , pain, and ability for ambulation     Time  6    Period  Weeks    Status  New    Target Date  07/05/17            Plan - 06/21/17 1654    Clinical Impression Statement  Pt with improving ability for full knee flexion, no pain at end range today. Ther ex performed for strengthening of knee and hips. Pt requires min/mod cuing for correct mechanics. Pt to benefit from conitnued care.     Rehab Potential  Good    PT Frequency  2x / week    PT Duration  6 weeks    PT Treatment/Interventions  ADLs/Self Care Home Management;Cryotherapy;Iontophoresis 4mg /ml Dexamethasone;Functional mobility training;Stair training;Electrical Stimulation;Gait training;DME Instruction;Ultrasound;Moist Heat;Therapeutic activities;Therapeutic exercise;Balance training;Neuromuscular re-education;Patient/family education;Orthotic Fit/Training;Passive range of motion;Manual techniques;Dry needling;Taping;Vasopneumatic Device    PT Next Visit Plan  ROM and strength     Consulted and Agree with Plan of Care  Patient       Patient will benefit from skilled therapeutic intervention in order to improve the following deficits and impairments:  Abnormal gait, Decreased range of motion, Difficulty walking, Decreased endurance, Decreased activity tolerance, Pain, Impaired flexibility, Hypomobility, Decreased balance, Decreased strength, Increased edema  Visit Diagnosis:  Chronic pain of right knee  Pain in left foot  Pain in right foot  Other abnormalities of gait and mobility     Problem List Patient Active Problem List   Diagnosis Date Noted  . Right knee pain 01/12/2017  . Internal hemorrhoids 05/14/2015  . PCP notes ------------ 12/25/2014  . Abnormal brain MRI-- pituitary adenoma  03/20/2014  . Elevated lipids 03/20/2014  .  Allergic rhinitis 05/21/2013  . Headache, migraine 05/21/2013  . Diabetes ([place]) 04/03/2013  . Dyspnea on exertion 06/29/2012  . PFO (patent foramen ovale) 10/11/2011  . Acne 10/11/2011  . Annual physical exam 10/26/2010  . GERD 09/11/2008  . DEPRESSION 04/20/2007  . INSOMNIA-SLEEP DISORDER-UNSPEC 04/19/2007  . Morbid obesity ([place]) 12/13/2006  . Asthma, chronic 12/13/2006   Lyndee Hensen, PT, DPT 4:55 PM  06/21/17    [facility] [place], [place], [address] Phone: [phone]   Fax:  [phone]  Name: Lauren Johns MRN: [id number] Date of Birth: [birth year]

## 2017-06-28 ENCOUNTER — Ambulatory Visit: Payer: BC Managed Care – PPO | Admitting: Physical Therapy

## 2017-06-28 ENCOUNTER — Encounter: Payer: Self-pay | Admitting: Physical Therapy

## 2017-06-28 DIAGNOSIS — M79671 Pain in right foot: Secondary | ICD-10-CM

## 2017-06-28 DIAGNOSIS — M79672 Pain in left foot: Secondary | ICD-10-CM

## 2017-06-28 DIAGNOSIS — M25561 Pain in right knee: Secondary | ICD-10-CM | POA: Diagnosis not present

## 2017-06-28 DIAGNOSIS — G8929 Other chronic pain: Secondary | ICD-10-CM

## 2017-06-28 DIAGNOSIS — R2689 Other abnormalities of gait and mobility: Secondary | ICD-10-CM | POA: Diagnosis not present

## 2017-06-29 NOTE — Therapy (Signed)
[facility] [address] [place], [place], [address] Phone: [phone]   Fax:  [phone]  Physical Therapy Treatment  Patient Details  Name: Lauren Johns MRN: [id number] Date of Birth: 02/09/1971 No Data Recorded  Encounter Date: 06/28/2017  PT End of Session - 06/28/17 1628    Visit Number  5    Number of Visits  12    Date for PT Re-Evaluation  07/05/17    Authorization Type  BCBS    PT Start Time  2542    PT Stop Time  1652    PT Time Calculation (min)  45 min    Activity Tolerance  Patient tolerated treatment well;Patient limited by pain    Behavior During Therapy  [facility] for tasks assessed/performed       Past Medical History:  Diagnosis Date  . Abnormal brain MRI   . Allergic rhinitis 05/21/2013  . Arthritis   . Asthma   . Chronic kidney disease   . Depression    h/o   . Diabetes ([place]) 04/03/2013  . Elevated lipids   . GERD (gastroesophageal reflux disease)   . Infertility, female    h/o  . Insomnia   . Obesity   . PFO (patent foramen ovale)    ??PFO repair:  in the 90s, was seen several times after and released (per patient)    Past Surgical History:  Procedure Laterality Date  . closed hearty valve repair  [place] , PFO repair??    There were no vitals filed for this visit.  Subjective Assessment - 06/28/17 1617    Subjective  Pt states minimal soreness today, but variable pain, was sore over the weekend.     Currently in Pain?  Yes    Pain Score  3     Pain Location  Knee    Pain Orientation  Right    Pain Descriptors / Indicators  Tightness;Sore    Pain Type  Chronic pain    Pain Onset  More than a month ago                      [facility] Adult PT Treatment/Exercise - 06/29/17 0001      Exercises   Exercises  Knee/Hip      Knee/Hip Exercises: Stretches   Active Hamstring Stretch  3 reps;30 seconds    Active Hamstring Stretch Limitations  seated      Knee/Hip Exercises:  Aerobic   Stationary Bike  L2 x 8 min      Knee/Hip Exercises: Standing   Heel Raises  20 reps    Hip Flexion  20 reps;Both;Knee bent    Hip Abduction  2 sets;10 reps;Both    Forward Step Up  Step Height: 6";15 reps;Both    Other Standing Knee Exercises  --      Knee/Hip Exercises: Seated   Long Arc Quad  20 reps    Long Arc Quad Weight  2 lbs.    Hamstring Curl  20 reps;Both    Hamstring Limitations  GTB      Knee/Hip Exercises: Supine   Quad Sets  20 reps    Short Arc Quad Sets  20 reps    Short Arc Quad Sets Limitations  2    Heel Slides  20 reps    Straight Leg Raises  Both;20 reps    Straight Leg Raises Limitations  2lb      Knee/Hip Exercises:  Sidelying   Hip ABduction  --      Manual Therapy   Manual Therapy  Taping    Manual therapy comments  2 I strips at anterior and medial knee for pain relief             PT Education - 06/28/17 1628    Education provided  Yes    Education Details  HEP    Person(s) Educated  Patient    Methods  Explanation    Comprehension  Verbalized understanding;Need further instruction       PT Short Term Goals - 05/25/17 1005      PT SHORT TERM GOAL #1   Title  Pt to be independent with initial HEP for ROM of knee.     Time  2    Period  Weeks    Status  New    Target Date  06/07/17      PT SHORT TERM GOAL #2   Title  Pt to report decreased pain to 4/10 with activity     Time  2    Period  Weeks    Status  New    Target Date  06/07/17        PT Long Term Goals - 05/25/17 1006      PT LONG TERM GOAL #1   Title  Pt to demo improved ROM of R knee, to be equal to L, to improve ability for transfers and ambulation    Time  6    Period  Weeks    Status  New    Target Date  07/05/17      PT LONG TERM GOAL #2   Title  Pt to demo increased strength of R knee and hip to be at least 4+/5, to improve stability and ambulation     Time  6    Period  Weeks    Status  New    Target Date  07/05/17      PT LONG TERM  GOAL #3   Title  Pt to report decreased pain in R knee, to 0-2/10 with standing and walking activity, to improve ability for work duties and community navigation     Time  6    Period  Weeks    Status  New    Target Date  07/05/17      PT LONG TERM GOAL #4   Title  Pt to be compliant with proper footwear (for her foot type) and/or orthotics, to improve alignment , pain, and ability for ambulation     Time  6    Period  Weeks    Status  New    Target Date  07/05/17            Plan - 06/29/17 1108    Clinical Impression Statement  Pt with improving ability to perform exercises, improving ROM, but still has soreness with overpressure into flexion. She has mild soreness with increased compression and weight bearing, increased pain with step ups today. Plan to continue strengthening and pain relief techniques as tolerated.     Rehab Potential  Good    PT Frequency  2x / week    PT Duration  6 weeks    PT Treatment/Interventions  ADLs/Self Care Home Management;Cryotherapy;Iontophoresis 4mg /ml Dexamethasone;Functional mobility training;Stair training;Electrical Stimulation;Gait training;DME Instruction;Ultrasound;Moist Heat;Therapeutic activities;Therapeutic exercise;Balance training;Neuromuscular re-education;Patient/family education;Orthotic Fit/Training;Passive range of motion;Manual techniques;Dry needling;Taping;Vasopneumatic Device    PT Next Visit Plan  ROM and strength  Consulted and Agree with Plan of Care  Patient       Patient will benefit from skilled therapeutic intervention in order to improve the following deficits and impairments:  Abnormal gait, Decreased range of motion, Difficulty walking, Decreased endurance, Decreased activity tolerance, Pain, Impaired flexibility, Hypomobility, Decreased balance, Decreased strength, Increased edema  Visit Diagnosis: Chronic pain of right knee  Pain in left foot  Pain in right foot  Other abnormalities of gait and  mobility     Problem List Patient Active Problem List   Diagnosis Date Noted  . Right knee pain 01/12/2017  . Internal hemorrhoids 05/14/2015  . PCP notes ------------ 12/25/2014  . Abnormal brain MRI-- pituitary adenoma  03/20/2014  . Elevated lipids 03/20/2014  . Allergic rhinitis 05/21/2013  . Headache, migraine 05/21/2013  . Diabetes ([place]) 04/03/2013  . Dyspnea on exertion 06/29/2012  . PFO (patent foramen ovale) 10/11/2011  . Acne 10/11/2011  . Annual physical exam 10/26/2010  . GERD 09/11/2008  . DEPRESSION 04/20/2007  . INSOMNIA-SLEEP DISORDER-UNSPEC 04/19/2007  . Morbid obesity ([place]) 12/13/2006  . Asthma, chronic 12/13/2006    Lyndee Hensen, PT, DPT 11:13 AM  06/29/17    [facility] [place], [place], [address] Phone: [phone]   Fax:  [phone]  Name: [place] DOST MRN: [id number] Date of Birth: Mar 12, 1971

## 2017-06-30 ENCOUNTER — Ambulatory Visit: Payer: BC Managed Care – PPO | Admitting: Physical Therapy

## 2017-06-30 DIAGNOSIS — G8929 Other chronic pain: Secondary | ICD-10-CM

## 2017-06-30 DIAGNOSIS — M79671 Pain in right foot: Secondary | ICD-10-CM

## 2017-06-30 DIAGNOSIS — M79672 Pain in left foot: Secondary | ICD-10-CM | POA: Diagnosis not present

## 2017-06-30 DIAGNOSIS — M25561 Pain in right knee: Secondary | ICD-10-CM

## 2017-06-30 DIAGNOSIS — R2689 Other abnormalities of gait and mobility: Secondary | ICD-10-CM | POA: Diagnosis not present

## 2017-07-01 ENCOUNTER — Encounter: Payer: Self-pay | Admitting: Physical Therapy

## 2017-07-01 NOTE — Therapy (Signed)
[facility] [address] [place], [place], [address] Phone: [phone]   Fax:  [phone]  Physical Therapy Treatment  Patient Details  Name: Lauren Johns MRN: [id number] Date of Birth: 03-15-1971 No data recorded  Encounter Date: 06/30/2017  PT End of Session - 07/01/17 2130    Visit Number  6    Number of Visits  12    Date for PT Re-Evaluation  07/05/17    Authorization Type  BCBS    PT Start Time  1605    PT Stop Time  1652    PT Time Calculation (min)  47 min    Activity Tolerance  Patient tolerated treatment well;Patient limited by pain    Behavior During Therapy  [facility] for tasks assessed/performed       Past Medical History:  Diagnosis Date  . Abnormal brain MRI   . Allergic rhinitis 05/21/2013  . Arthritis   . Asthma   . Chronic kidney disease   . Depression    h/o   . Diabetes ([place]) 04/03/2013  . Elevated lipids   . GERD (gastroesophageal reflux disease)   . Infertility, female    h/o  . Insomnia   . Obesity   . PFO (patent foramen ovale)    ??PFO repair:  in the 90s, was seen several times after and released (per patient)    Past Surgical History:  Procedure Laterality Date  . closed hearty valve repair  [place] , PFO repair??    There were no vitals filed for this visit.  Subjective Assessment - 07/01/17 2126    Subjective  Pt states varying pain, mildly increased today.     Currently in Pain?  Yes    Pain Score  4     Pain Location  Knee    Pain Orientation  Right    Pain Descriptors / Indicators  Tightness;Sore    Pain Type  Chronic pain    Pain Onset  More than a month ago    Pain Frequency  Constant                No data recorded       [facility] Adult PT Treatment/Exercise - 07/01/17 2133      Exercises   Exercises  Knee/Hip      Knee/Hip Exercises: Stretches   Active Hamstring Stretch  --    Active Hamstring Stretch Limitations  --      Knee/Hip Exercises:  Aerobic   Stationary Bike  L2 x 8 min      Knee/Hip Exercises: Standing   Heel Raises  20 reps    Hip Flexion  20 reps;Both;Knee bent    Hip Abduction  2 sets;10 reps;Both    Abduction Limitations  2lb    Forward Step Up  --    Other Standing Knee Exercises  Air Ex weight shifts L/R and A/P x25 each; SLS, 20 sec x3 bil      Knee/Hip Exercises: Seated   Long Arc Quad  20 reps    Long Arc Quad Weight  2 lbs.    Hamstring Curl  20 reps;Both    Hamstring Limitations  GTB      Knee/Hip Exercises: Supine   Quad Sets  20 reps    Short Arc [employer]  20 reps    Short Arc Quad Sets Limitations  2lb    Heel Slides  --    Straight Leg Raises  Both;20 reps    Straight Leg Raises Limitations  2lb      Manual Therapy   Manual Therapy  Joint mobilization;Passive ROM    Manual therapy comments  --    Joint Mobilization  To increase knee extension    Passive ROM  For flexion and extension             PT Education - 07/01/17 2130    Education provided  Yes    Education Details  HEP, footwear,     Person(s) Educated  Patient    Methods  Explanation    Comprehension  Verbalized understanding       PT Short Term Goals - 07/01/17 2131      PT SHORT TERM GOAL #1   Title  Pt to be independent with initial HEP for ROM of knee.     Time  2    Period  Weeks    Status  Achieved      PT SHORT TERM GOAL #2   Title  Pt to report decreased pain to 4/10 with activity     Time  2    Period  Weeks    Status  Achieved        PT Long Term Goals - 05/25/17 1006      PT LONG TERM GOAL #1   Title  Pt to demo improved ROM of R knee, to be equal to L, to improve ability for transfers and ambulation    Time  6    Period  Weeks    Status  New    [place] Date  07/05/17      PT LONG TERM GOAL #2   Title  Pt to demo increased strength of R knee and hip to be at least 4+/5, to improve stability and ambulation     Time  6    Period  Weeks    Status  New    Target Date  07/05/17      PT  LONG TERM GOAL #3   Title  Pt to report decreased pain in R knee, to 0-2/10 with standing and walking activity, to improve ability for work duties and community navigation     Time  6    Period  Weeks    Status  New    Target Date  07/05/17      PT LONG TERM GOAL #4   Title  Pt to be compliant with proper footwear (for her foot type) and/or orthotics, to improve alignment , pain, and ability for ambulation     Time  6    Period  Weeks    Status  New    Target Date  07/05/17            Plan - 07/01/17 2132    Clinical Impression Statement  Pt with improving extension, but still lacking ability for full TKE. She is able to perform ther ex withtout increased pain today. Step ups not done today, due to increased pain last visit. Pt to benefit from continued strengthening.     Rehab Potential  Good    PT Frequency  2x / week    PT Duration  6 weeks    PT Treatment/Interventions  ADLs/Self Care Home Management;Cryotherapy;Iontophoresis 4mg /ml Dexamethasone;Functional mobility training;Stair training;Electrical Stimulation;Gait training;DME Instruction;Ultrasound;Moist Heat;Therapeutic activities;Therapeutic exercise;Balance training;Neuromuscular re-education;Patient/family education;Orthotic Fit/Training;Passive range of motion;Manual techniques;Dry needling;Taping;Vasopneumatic Device    PT Next Visit Plan  ROM and strength     Consulted and  Agree with Plan of Care  Patient       Patient will benefit from skilled therapeutic intervention in order to improve the following deficits and impairments:  Abnormal gait, Decreased range of motion, Difficulty walking, Decreased endurance, Decreased activity tolerance, Pain, Impaired flexibility, Hypomobility, Decreased balance, Decreased strength, Increased edema  Visit Diagnosis: Chronic pain of right knee  Pain in left foot  Pain in right foot  Other abnormalities of gait and mobility     Problem List Patient Active Problem List    Diagnosis Date Noted  . Right knee pain 01/12/2017  . Internal hemorrhoids 05/14/2015  . PCP notes ------------ 12/25/2014  . Abnormal brain MRI-- pituitary adenoma  03/20/2014  . Elevated lipids 03/20/2014  . Allergic rhinitis 05/21/2013  . Headache, migraine 05/21/2013  . Diabetes ([place]) 04/03/2013  . Dyspnea on exertion 06/29/2012  . PFO (patent foramen ovale) 10/11/2011  . Acne 10/11/2011  . Annual physical exam 10/26/2010  . GERD 09/11/2008  . DEPRESSION 04/20/2007  . INSOMNIA-SLEEP DISORDER-UNSPEC 04/19/2007  . Morbid obesity ([place]) 12/13/2006  . Asthma, chronic 12/13/2006    Lyndee Hensen, PT, DPT 9:36 PM  07/01/17   [facility] [place], [place], [address] Phone: [phone]   Fax:  [phone]  Name: ODEAL WELDEN MRN: [id number] Date of Birth: 08/24/1970

## 2017-07-14 ENCOUNTER — Ambulatory Visit: Payer: BC Managed Care – PPO | Admitting: Physical Therapy

## 2017-07-14 DIAGNOSIS — G8929 Other chronic pain: Secondary | ICD-10-CM

## 2017-07-14 DIAGNOSIS — M79672 Pain in left foot: Secondary | ICD-10-CM

## 2017-07-14 DIAGNOSIS — M79671 Pain in right foot: Secondary | ICD-10-CM | POA: Diagnosis not present

## 2017-07-14 DIAGNOSIS — M25561 Pain in right knee: Secondary | ICD-10-CM | POA: Diagnosis not present

## 2017-07-14 DIAGNOSIS — R2689 Other abnormalities of gait and mobility: Secondary | ICD-10-CM | POA: Diagnosis not present

## 2017-07-14 NOTE — Therapy (Signed)
[facility] [address] [place], [place], [address] Phone: [phone]   Fax:  [phone]  Physical Therapy Treatment/ReCert  Patient Details  Name: Lauren Johns MRN: [id number] Date of Birth: [birth year] No data recorded  Encounter Date: 07/14/2017  PT End of Session - 07/14/17 1629    Visit Number  7    Number of Visits  16    Date for PT Re-Evaluation  08/04/17    Authorization Type  BCBS    PT Start Time  1545    PT Stop Time  1634    PT Time Calculation (min)  49 min    Activity Tolerance  Patient tolerated treatment well;Patient limited by pain    Behavior During Therapy  [facility] for tasks assessed/performed       Past Medical History:  Diagnosis Date  . Abnormal brain MRI   . Allergic rhinitis 05/21/2013  . Arthritis   . Asthma   . Chronic kidney disease   . Depression    h/o   . Diabetes ([place]) 04/03/2013  . Elevated lipids   . GERD (gastroesophageal reflux disease)   . Infertility, female    h/o  . Insomnia   . Obesity   . PFO (patent foramen ovale)    ??PFO repair:  in the 90s, was seen several times after and released (per patient)    Past Surgical History:  Procedure Laterality Date  . closed hearty valve repair  [place] , PFO repair??    There were no vitals filed for this visit.  Subjective Assessment - 07/14/17 1639    Subjective  Pt states less pain overall in knee, but still has pain and stiffness daily, increased with increased standing, walking, and getting up from seated position. She notices that her knee is buckling much less, only a few times in last couple weeks. She also states improved walking ability. She does still have pain in B feet, has not yet obained new shoes, but is wearing sneakers vs flat shoes.     Currently in Pain?  Yes    Pain Score  3     Pain Location  Knee    Pain Orientation  Right    Pain Descriptors / Indicators  Tightness;Sore    Pain Type  Chronic pain     Pain Onset  More than a month ago    Pain Frequency  Intermittent    Aggravating Factors   Standing, walking, stairs         [facility] PT Assessment - 07/14/17 0001      AROM   Right Knee Extension  -3    Right Knee Flexion  110    Left Knee Extension  -3    Left Knee Flexion  116      Strength   Overall Strength Comments  R knee: flex: 4+/5, Ext: 4/5;  L: 4+/5   Bil Hips: 4/5      Palpation   Palpation comment  Increased pain at medial joint line, increased with rotation and weight bearing.                    [facility] Adult PT Treatment/Exercise - 07/14/17 1553      Exercises   Exercises  Knee/Hip      Knee/Hip Exercises: Aerobic   Stationary Bike  L2 x 8 min      Knee/Hip Exercises: Standing   Heel Raises  20 reps  Hip Flexion  20 reps;Both;Knee bent    Hip Abduction  2 sets;10 reps;Both    Abduction Limitations  2lb    Other Standing Knee Exercises  Air Ex weight shifts L/R and A/P x25 each; SLS, 20 sec x3 bil    Other Standing Knee Exercises  Tandem stance with head turns and UE flexion 2x10 each Bil;       Knee/Hip Exercises: Seated   Long Arc Quad  20 reps    Long Arc Quad Weight  -- 2.5    Hamstring Curl  20 reps;Both    Hamstring Limitations  GTB      Knee/Hip Exercises: Supine   Quad Sets  20 reps    Short Arc Quad Sets  --    Straight Leg Raises  Both;20 reps    Straight Leg Raises Limitations  2lb      Knee/Hip Exercises: Sidelying   Hip ABduction  20 reps;Both      Manual Therapy   Manual Therapy  Joint mobilization;Passive ROM    Joint Mobilization  To increase knee extension    Passive ROM  For flexion and extension             PT Education - 07/14/17 1639    Education provided  Yes    Education Details  HEP importance of performing for increased quad strength    Person(s) Educated  Patient    Methods  Explanation;Verbal cues    Comprehension  Verbalized understanding;Need further instruction;Verbal cues required       PT  Short Term Goals - 07/14/17 1637      PT SHORT TERM GOAL #1   Title  Pt to be independent with initial HEP for ROM of knee.     Time  2    Period  Weeks    Status  Achieved      PT SHORT TERM GOAL #2   Title  Pt to report decreased pain to 4/10 with activity     Time  2    Period  Weeks    Status  Achieved        PT Long Term Goals - 07/14/17 1638      PT LONG TERM GOAL #1   Title  Pt to demo improved ROM of R knee, to be equal to L, to improve ability for transfers and ambulation    Time  6    Period  Weeks    Status  Partially Met      PT LONG TERM GOAL #2   Title  Pt to demo increased strength of R knee and hip to be at least 4+/5, to improve stability and ambulation     Time  6    Period  Weeks    Status  Partially Met      PT LONG TERM GOAL #3   Title  Pt to report decreased pain in R knee, to 0-2/10 with standing and walking activity, to improve ability for work duties and community navigation     Time  6    Period  Weeks    Status  Partially Met      PT LONG TERM GOAL #4   Title  Pt to be compliant with proper footwear (for her foot type) and/or orthotics, to improve alignment , pain, and ability for ambulation     Time  6    Period  Weeks    Status  Partially Met  Plan - 07/14/17 1641    Clinical Impression Statement  Pt educated on importance of performing HEP, to increase quad strength. She has made good improvements in ROM, but still has mild restrictions on R. She also has improved knee and hip strength, but still has mild deficit for R quad. She demonstrates significantly improved ambulation pattern, with improved stride length, and ability for TKE and heel strike. She has been educated on obtaining proper footwear for reducing foot pain, but has not yet done so. She is wearing sneakers vs flat shoes, but current sneakers not appropriate for her foot type or size. Pt making good improvements, but continues to have pain at medial knee. She will  benefit from continuation of skilled PT, plan to work towards d/c to HEP in next few weeks. Pt only able to attend 1x/wk due to work schedule.     Rehab Potential  Good    PT Frequency  2x / week    PT Duration  3 weeks    PT Treatment/Interventions  ADLs/Self Care Home Management;Cryotherapy;Iontophoresis '4mg'$ /ml Dexamethasone;Functional mobility training;Stair training;Electrical Stimulation;Gait training;DME Instruction;Ultrasound;Moist Heat;Therapeutic activities;Therapeutic exercise;Balance training;Neuromuscular re-education;Patient/family education;Orthotic Fit/Training;Passive range of motion;Manual techniques;Dry needling;Taping;Vasopneumatic Device    PT Next Visit Plan  ROM and strength     Consulted and Agree with Plan of Care  Patient       Patient will benefit from skilled therapeutic intervention in order to improve the following deficits and impairments:  Abnormal gait, Decreased range of motion, Difficulty walking, Decreased endurance, Decreased activity tolerance, Pain, Impaired flexibility, Hypomobility, Decreased balance, Decreased strength, Increased edema  Visit Diagnosis: Chronic pain of right knee  Pain in left foot  Pain in right foot  Other abnormalities of gait and mobility     Problem List Patient Active Problem List   Diagnosis Date Noted  . Right knee pain 01/12/2017  . Internal hemorrhoids 05/14/2015  . PCP notes ------------ 12/25/2014  . Abnormal brain MRI-- pituitary adenoma  03/20/2014  . Elevated lipids 03/20/2014  . Allergic rhinitis 05/21/2013  . Headache, migraine 05/21/2013  . Diabetes ([place]) 04/03/2013  . Dyspnea on exertion 06/29/2012  . PFO (patent foramen ovale) 10/11/2011  . Acne 10/11/2011  . Annual physical exam 10/26/2010  . GERD 09/11/2008  . DEPRESSION 04/20/2007  . INSOMNIA-SLEEP DISORDER-UNSPEC 04/19/2007  . Morbid obesity ([place]) 12/13/2006  . Asthma, chronic 12/13/2006   Lyndee Hensen, PT, DPT 4:45 PM   07/14/17    [facility] [place], [place], [address] Phone: [phone]   Fax:  [phone]  Name: JONAI WEYLAND MRN: [id number] Date of Birth: 06/04/1970

## 2017-07-21 ENCOUNTER — Ambulatory Visit: Payer: BC Managed Care – PPO | Admitting: Physical Therapy

## 2017-07-21 DIAGNOSIS — M79672 Pain in left foot: Secondary | ICD-10-CM | POA: Diagnosis not present

## 2017-07-21 DIAGNOSIS — M79671 Pain in right foot: Secondary | ICD-10-CM | POA: Diagnosis not present

## 2017-07-21 DIAGNOSIS — R2689 Other abnormalities of gait and mobility: Secondary | ICD-10-CM

## 2017-07-21 DIAGNOSIS — M25561 Pain in right knee: Secondary | ICD-10-CM | POA: Diagnosis not present

## 2017-07-21 DIAGNOSIS — G8929 Other chronic pain: Secondary | ICD-10-CM

## 2017-07-21 NOTE — Therapy (Signed)
[facility] [address] [place], [place], [address] Phone: [phone]   Fax:  [phone]  Physical Therapy Treatment/Re-Cert  Patient Details  Name: Lauren Johns MRN: [id number] Date of Birth: December 18, 1970 No data recorded  Encounter Date: 07/21/2017  PT End of Session - 07/21/17 1606    Visit Number  8    Number of Visits  16    Date for PT Re-Evaluation  08/25/17    Authorization Type  BCBS    PT Start Time  1556    PT Stop Time  1642    PT Time Calculation (min)  46 min    Activity Tolerance  Patient tolerated treatment well;Patient limited by pain    Behavior During Therapy  [facility] for tasks assessed/performed       Past Medical History:  Diagnosis Date  . Abnormal brain MRI   . Allergic rhinitis 05/21/2013  . Arthritis   . Asthma   . Chronic kidney disease   . Depression    h/o   . Diabetes ([place]) 04/03/2013  . Elevated lipids   . GERD (gastroesophageal reflux disease)   . Infertility, female    h/o  . Insomnia   . Obesity   . PFO (patent foramen ovale)    ??PFO repair:  in the 90s, was seen several times after and released (per patient)    Past Surgical History:  Procedure Laterality Date  . closed hearty valve repair  [place] , PFO repair??    There were no vitals filed for this visit.  Subjective Assessment - 07/21/17 1605    Subjective  Pt states knee is stiff today, sat at work today more than usual.     Currently in Pain?  Yes    Pain Score  3     Pain Orientation  Right    Pain Descriptors / Indicators  Tightness    Pain Type  Chronic pain    Pain Onset  More than a month ago    Pain Frequency  Intermittent                       [facility] Adult PT Treatment/Exercise - 07/21/17 1606      Ambulation/Gait   Gait Comments  35 ft x8 with practice for heel to toe pattern       Exercises   Exercises  Knee/Hip      Knee/Hip Exercises: Aerobic   Stationary Bike  L2 x 8 min       Knee/Hip Exercises: Standing   Heel Raises  20 reps    Hip Flexion  20 reps;Both;Knee bent    Hip Abduction  2 sets;10 reps;Both    Abduction Limitations  2lb    Lateral Step Up  10 reps;Both;Step Height: 6"    Forward Step Up  Step Height: 6";15 reps;Both    Functional Squat  2 sets;10 reps    Other Standing Knee Exercises  Air Ex weight shifts L/R and A/P x25 each; SLS, 20 sec x3 bil    Other Standing Knee Exercises  Tandem stance with UE flexion 2x10 each Bil;       Knee/Hip Exercises: Seated   Long Arc Quad  20 reps    Long Arc Quad Weight  -- 2.5    Hamstring Curl  20 reps;Both    Hamstring Limitations  GTB      Knee/Hip Exercises: Supine   Quad Sets  20  reps    Straight Leg Raises  Both;20 reps    Straight Leg Raises Limitations  2lb      Knee/Hip Exercises: Sidelying   Hip ABduction  20 reps;Both      Manual Therapy   Manual Therapy  Joint mobilization;Passive ROM    Joint Mobilization  To increase knee extension    Passive ROM  For flexion and extension             PT Education - 07/21/17 1606    Education provided  Yes    Education Details  HEP    Person(s) Educated  Patient    Methods  Explanation    Comprehension  Verbalized understanding       PT Short Term Goals - 07/14/17 1637      PT SHORT TERM GOAL #1   Title  Pt to be independent with initial HEP for ROM of knee.     Time  2    Period  Weeks    Status  Achieved      PT SHORT TERM GOAL #2   Title  Pt to report decreased pain to 4/10 with activity     Time  2    Period  Weeks    Status  Achieved        PT Long Term Goals - 07/14/17 1638      PT LONG TERM GOAL #1   Title  Pt to demo improved ROM of R knee, to be equal to L, to improve ability for transfers and ambulation    Time  6    Period  Weeks    Status  Partially Met      PT LONG TERM GOAL #2   Title  Pt to demo increased strength of R knee and hip to be at least 4+/5, to improve stability and ambulation     Time  6     Period  Weeks    Status  Partially Met      PT LONG TERM GOAL #3   Title  Pt to report decreased pain in R knee, to 0-2/10 with standing and walking activity, to improve ability for work duties and community navigation     Time  6    Period  Weeks    Status  Partially Met      PT LONG TERM GOAL #4   Title  Pt to be compliant with proper footwear (for her foot type) and/or orthotics, to improve alignment , pain, and ability for ambulation     Time  6    Period  Weeks    Status  Partially Met            Plan - 07/21/17 1653    Clinical Impression Statement  Pt improving with strength in LE, as well as ROM. She has increased frequency and consistency of HEP. She continues to have pain and stiffness at lateral knee daily. She wil benefit from continued/progressive strengthening and stabilization, as well as education on final HEP. Pt going out of town and will be unable to attend for the next 2 weeks. Plan of care date extended, to allow 2 more visits, plan to d/c at that time.     Rehab Potential  Good    PT Frequency  2x / week    PT Duration  3 weeks    PT Treatment/Interventions  ADLs/Self Care Home Management;Cryotherapy;Iontophoresis '4mg'$ /ml Dexamethasone;Functional mobility training;Stair training;Electrical Stimulation;Gait training;DME Instruction;Ultrasound;Moist Heat;Therapeutic activities;Therapeutic exercise;Balance training;Neuromuscular  re-education;Patient/family education;Orthotic Fit/Training;Passive range of motion;Manual techniques;Dry needling;Taping;Vasopneumatic Device    PT Next Visit Plan  ROM and strength     Consulted and Agree with Plan of Care  Patient       Patient will benefit from skilled therapeutic intervention in order to improve the following deficits and impairments:  Abnormal gait, Decreased range of motion, Difficulty walking, Decreased endurance, Decreased activity tolerance, Pain, Impaired flexibility, Hypomobility, Decreased balance, Decreased  strength, Increased edema  Visit Diagnosis: Chronic pain of right knee  Pain in left foot  Pain in right foot  Other abnormalities of gait and mobility     Problem List Patient Active Problem List   Diagnosis Date Noted  . Right knee pain 01/12/2017  . Internal hemorrhoids 05/14/2015  . PCP notes ------------ 12/25/2014  . Abnormal brain MRI-- pituitary adenoma  03/20/2014  . Elevated lipids 03/20/2014  . Allergic rhinitis 05/21/2013  . Headache, migraine 05/21/2013  . Diabetes ([place]) 04/03/2013  . Dyspnea on exertion 06/29/2012  . PFO (patent foramen ovale) 10/11/2011  . Acne 10/11/2011  . Annual physical exam 10/26/2010  . GERD 09/11/2008  . DEPRESSION 04/20/2007  . INSOMNIA-SLEEP DISORDER-UNSPEC 04/19/2007  . Morbid obesity ([place]) 12/13/2006  . Asthma, chronic 12/13/2006   Lyndee Hensen, PT, DPT 4:55 PM  07/21/17    [facility] [place], [place], [address] Phone: [phone]   Fax:  [phone]  Name: Lauren Johns MRN: [id number] Date of Birth: [birth year]

## 2017-08-11 ENCOUNTER — Ambulatory Visit: Payer: BC Managed Care – PPO | Admitting: Physical Therapy

## 2017-08-11 DIAGNOSIS — G8929 Other chronic pain: Secondary | ICD-10-CM | POA: Diagnosis not present

## 2017-08-11 DIAGNOSIS — M25561 Pain in right knee: Secondary | ICD-10-CM | POA: Diagnosis not present

## 2017-08-12 ENCOUNTER — Encounter: Payer: Self-pay | Admitting: Physical Therapy

## 2017-08-12 NOTE — Therapy (Signed)
[place] [address] [place], [place], [address] Phone: [phone]   Fax:  [phone]  Physical Therapy Treatment/Discharge  Patient Details  Name: Lauren Johns MRN: [id number] Date of Birth: 01-Mar-1971 No data recorded  Encounter Date: 08/11/2017  PT End of Session - 08/12/17 1441    Visit Number  9    Number of Visits  16    Date for PT Re-Evaluation  08/25/17    Authorization Type  [employer]    PT Start Time  1600    PT Stop Time  4562    PT Time Calculation (min)  41 min    Activity Tolerance  Patient tolerated treatment well    Behavior During Therapy  [facility] for tasks assessed/performed       Past Medical History:  Diagnosis Date  . Abnormal brain MRI   . Allergic rhinitis 05/21/2013  . Arthritis   . Asthma   . Chronic kidney disease   . Depression    h/o   . Diabetes ([place]) 04/03/2013  . Elevated lipids   . GERD (gastroesophageal reflux disease)   . Infertility, female    h/o  . Insomnia   . Obesity   . PFO (patent foramen ovale)    ??PFO repair:  in the 90s, was seen several times after and released (per patient)    Past Surgical History:  Procedure Laterality Date  . closed hearty valve repair  [place] , PFO repair??    There were no vitals filed for this visit.  Subjective Assessment - 08/12/17 1439    Subjective  Pt states overall, knee is improved, but still feels stiffness and mild soreness daily. She states she has been doing HEP    Currently in Pain?  Yes    Pain Score  2     Pain Location  Knee    Pain Orientation  Right    Pain Descriptors / Indicators  Tightness;Aching    Pain Type  Chronic pain    Pain Onset  More than a month ago    Pain Frequency  Intermittent         [facility] PT Assessment - 08/11/17 1612      AROM   Right Knee Extension  -4    Right Knee Flexion  106    Left Knee Extension  -2    Left Knee Flexion  120      Strength   Overall Strength Comments  R knee  5/5;  L knee 5/5                    [facility] Adult PT Treatment/Exercise - 08/11/17 1612      Ambulation/Gait   Gait Comments  35 ft x6      Knee/Hip Exercises: Aerobic   Stationary Bike  L2 x 8 min      Knee/Hip Exercises: Standing   Hip Flexion  20 reps;Both;Knee bent    Hip Abduction  2 sets;10 reps;Both    Abduction Limitations  2lb    Other Standing Knee Exercises  Air Ex weight shifts L/R and A/P x25 each; SLS, 20 sec x3 bil    Other Standing Knee Exercises  Tandem stance with UE flexion 2x10 each Bil;       Knee/Hip Exercises: Seated   Long Arc Quad  20 reps    Long Arc Quad Weight  2 lbs.    Hamstring Curl  20 reps;Both  Hamstring Limitations  GTB      Knee/Hip Exercises: Supine   Straight Leg Raises  Both;20 reps    Straight Leg Raises Limitations  2lb      Manual Therapy   Joint Mobilization  To increase knee flexion and extension    Passive ROM  For flexion and extension             PT Education - 08/12/17 1441    Education provided  Yes    Education Details  Final HEP , d/c plan    Person(s) Educated  Patient    Methods  Explanation    Comprehension  Verbalized understanding       PT Short Term Goals - 07/14/17 1637      PT SHORT TERM GOAL #1   Title  Pt to be independent with initial HEP for ROM of knee.     Time  2    Period  Weeks    Status  Achieved      PT SHORT TERM GOAL #2   Title  Pt to report decreased pain to 4/10 with activity     Time  2    Period  Weeks    Status  Achieved        PT Long Term Goals - 08/12/17 1442      PT LONG TERM GOAL #1   Title  Pt to demo improved ROM of R knee, to be equal to L, to improve ability for transfers and ambulation    Time  6    Period  Weeks    Status  Partially Met      PT LONG TERM GOAL #2   Title  Pt to demo increased strength of R knee and hip to be at least 4+/5, to improve stability and ambulation     Time  6    Period  Weeks    Status  Achieved      PT LONG TERM  GOAL #3   Title  Pt to report decreased pain in R knee, to 0-2/10 with standing and walking activity, to improve ability for work duties and community navigation     Time  6    Period  Weeks    Status  Achieved      PT LONG TERM GOAL #4   Title  Pt to be compliant with proper footwear (for her foot type) and/or orthotics, to improve alignment , pain, and ability for ambulation     Time  6    Period  Weeks    Status  Partially Met            Plan - 08/12/17 1443    Clinical Impression Statement  Pt has improved ROM bilaterally, and improved strength. Strength is WNL. ROM on R still mildly limited due to joint stiffness and pain. She has much improved amblation mechanics, with improved knee extension, and improved heel strike. Pt has been educated on importance of proper footwear, but has not yet obtained, though would likely help foot and knee pain. Pt has met most goals at this time, and is ready for d/c to HEP. She has not had full elimination of pain.  She continues to have soreness and stiffness, likey coming from OA and/or meniscal pathology. Discussed recommendation to follow back up with MD as needed, if pain increases. Disussed importance of frequent ROM and HEP. Pt in agreement with D/c.     Rehab Potential  Good  PT Frequency  2x / week    PT Duration  3 weeks    PT Treatment/Interventions  ADLs/Self Care Home Management;Cryotherapy;Iontophoresis 78m/ml Dexamethasone;Functional mobility training;Stair training;Electrical Stimulation;Gait training;DME Instruction;Ultrasound;Moist Heat;Therapeutic activities;Therapeutic exercise;Balance training;Neuromuscular re-education;Patient/family education;Orthotic Fit/Training;Passive range of motion;Manual techniques;Dry needling;Taping;Vasopneumatic Device    PT Next Visit Plan  ROM and strength     Consulted and Agree with Plan of Care  Patient       Patient will benefit from skilled therapeutic intervention in order to improve the  following deficits and impairments:  Abnormal gait, Decreased range of motion, Difficulty walking, Decreased endurance, Decreased activity tolerance, Pain, Impaired flexibility, Hypomobility, Decreased balance, Decreased strength, Increased edema  Visit Diagnosis: Chronic pain of right knee     Problem List Patient Active Problem List   Diagnosis Date Noted  . Right knee pain 01/12/2017  . Internal hemorrhoids 05/14/2015  . PCP notes ------------ 12/25/2014  . Abnormal brain MRI-- pituitary adenoma  03/20/2014  . Elevated lipids 03/20/2014  . Allergic rhinitis 05/21/2013  . Headache, migraine 05/21/2013  . [place] 04/03/2013  . Dyspnea on exertion 06/29/2012  . PFO (patent foramen ovale) 10/11/2011  . Acne 10/11/2011  . Annual physical exam 10/26/2010  . GERD 09/11/2008  . DEPRESSION 04/20/2007  . INSOMNIA-SLEEP DISORDER-UNSPEC 04/19/2007  . Morbid obesity ([place] 12/13/2006  . Asthma, chronic 12/13/2006   LLyndee Hensen PT, DPT 2:49 PM  08/12/17    [facility] [place] [address]Phone: [phone]  Fax:  [phone] Name: Lauren KOOSMRN: [id number] of Birth: 608-Aug-1972  PHYSICAL THERAPY DISCHARGE SUMMARY  Visits from Start of Care: 9  Plan: Patient agrees to discharge.  Patient goals were met. Patient is being discharged due to meeting the stated rehab goals.  ?????      LLyndee Hensen PT, DPT 2:50 PM  08/12/17

## 2017-10-07 LAB — HM DIABETES EYE EXAM

## 2017-12-16 ENCOUNTER — Ambulatory Visit: Payer: BC Managed Care – PPO | Admitting: Family

## 2017-12-16 ENCOUNTER — Encounter: Payer: Self-pay | Admitting: Family

## 2017-12-16 VITALS — BP 128/54 | HR 62 | Temp 98.7°F | Resp 16 | Ht 65.0 in | Wt 301.4 lb

## 2017-12-16 DIAGNOSIS — J40 Bronchitis, not specified as acute or chronic: Secondary | ICD-10-CM | POA: Diagnosis not present

## 2017-12-16 MED ORDER — GUAIFENESIN-CODEINE 100-10 MG/5ML PO SYRP: 5.0000 mL | ORAL_SOLUTION | Freq: Three times a day (TID) | ORAL | 0 refills | Status: DC | PRN

## 2017-12-16 MED ORDER — AZITHROMYCIN 250 MG PO TABS: ORAL_TABLET | ORAL | 0 refills | Status: DC

## 2017-12-16 MED FILL — GUAIATUSSIN AC LIQUID: 100-10 | 5 days supply | Qty: 75 | Fill #0

## 2017-12-16 MED FILL — AZITHROMYCIN 250 MG TABLET: 250 | 5 days supply | Qty: 6 | Fill #0

## 2017-12-16 NOTE — Progress Notes (Signed)
Subjective:    Patient ID: Lauren Johns, female    DOB: Jun 22, 1970, 47 y.o.   MRN: [id number]  HPI  Patient is a 47 yr old female who presents today with multiple symptoms. Presenting symptoms was sore throat which began 7 days ago.  Then she developed chest congestion. Now having nasal drainage (yellow) and yellow chest mucous.  Works in a school.   Review of Systems    see HPI  Past Medical History:  Diagnosis Date  . Abnormal brain MRI   . Allergic rhinitis 05/21/2013  . Arthritis   . Asthma   . Chronic kidney disease   . Depression    h/o   . Diabetes ([place]) 04/03/2013  . Elevated lipids   . GERD (gastroesophageal reflux disease)   . Infertility, female    h/o  . Insomnia   . Obesity   . PFO (patent foramen ovale)    ??PFO repair:  in the 90s, was seen several times after and released (per patient)     Social History   Socioeconomic History  . Marital status: Married    Spouse name: Not on file  . Number of children: 0  . Years of education: Not on file  . Highest education level: Not on file  Occupational History  . Occupation: Education officer, museum -support  Social Needs  . Financial resource strain: Not on file  . Food insecurity:    Worry: Not on file    Inability: Not on file  . Transportation needs:    Medical: Not on file    Non-medical: Not on file  Tobacco Use  . Smoking status: Never Smoker  . Smokeless tobacco: Never Used  Substance and Sexual Activity  . Alcohol use: Yes    Comment: socially   . Drug use: No  . Sexual activity: Not on file  Lifestyle  . Physical activity:    Days per week: Not on file    Minutes per session: Not on file  . Stress: Not on file  Relationships  . Social connections:    Talks on phone: Not on file    Gets together: Not on file    Attends religious service: Not on file    Active member of club or organization: Not on file    Attends meetings of clubs or organizations: Not on file    Relationship status: Not on  file  . Intimate partner violence:    Fear of current or ex partner: Not on file    Emotionally abused: Not on file    Physically abused: Not on file    Forced sexual activity: Not on file  Other Topics Concern  . Not on file  Social History Narrative   mother is Elodia Florence, one of my patients    Lives w/ husband    Past Surgical History:  Procedure Laterality Date  . closed hearty valve repair  [place] , PFO repair??    Family History  Problem Relation Age of Onset  . Heart attack Father 76  . Diabetes Mother   . Hypertension Mother        M, F  . Prostate cancer Maternal Grandfather   . Cancer Maternal Grandfather        late in life   . Breast cancer Neg Hx   . Colon cancer Neg Hx     Allergies  Allergen Reactions  . Contrave [Naltrexone-Bupropion Hcl Er]     Multiple  s/e, hot flashes, back pain, lethargic  . Gadolinium Derivatives Itching and Rash    Worsening symptoms 30 min after oral benadryl.  Pt was referred to ED by Dr. Anselm Pancoast.  . Metformin And Related     Generalize itching, rash (arms)  . Oxycodone-Acetaminophen Nausea And Vomiting  . Penicillins Hives    Has patient had a PCN reaction causing immediate rash, facial/tongue/throat swelling, SOB or lightheadedness with hypotension:unsure Has patient had a PCN reaction causing severe rash involving mucus membranes or skin necrosis:unsure Has patient had a PCN reaction that required hospitalization:No Has patient had a PCN reaction occurring within the last 10 years:No If all of the above answers are "NO", then may proceed with Cephalosporin use. Childhood reaction  . Contrast Media [Iodinated Diagnostic Agents] Itching and Rash    Current Outpatient Medications on File Prior to Visit  Medication Sig Dispense Refill  . acetaminophen (TYLENOL) 500 MG tablet Take 1,000 mg by mouth every 6 (six) hours as needed.    Marland Kitchen albuterol (PROVENTIL) (2.5 MG/3ML) 0.083% nebulizer solution Take 3 mLs (2.5 mg  total) by nebulization every 6 (six) hours as needed for wheezing or shortness of breath. 150 mL 5  . albuterol (VENTOLIN HFA) 108 (90 Base) MCG/ACT inhaler Inhale 1 puff into the lungs every 6 (six) hours as needed for wheezing or shortness of breath. 18 g 5  . azelastine (ASTELIN) 0.1 % nasal spray Place 2 sprays into both nostrils at bedtime as needed for rhinitis. Use in each nostril as directed 30 mL 5  . fluticasone (FLONASE) 50 MCG/ACT nasal spray Place 2 sprays into both nostrils daily. 16 g 1  . fluticasone furoate-vilanterol (BREO ELLIPTA) 100-25 MCG/INH AEPB Inhale 1 puff into the lungs daily. 28 each 6  . Multiple Vitamin (MULTIVITAMIN WITH MINERALS) TABS tablet Take 1 tablet by mouth daily. ALIVE     No current facility-administered medications on file prior to visit.     BP (!) 128/54 (BP Location: Right Arm, Patient Position: Sitting, Cuff Size: Large)   Pulse 62   Temp 98.7 F (37.1 C) (Oral)   Resp 16   Ht 5\' 5"  (1.651 m)   Wt (!) 301 lb 6.4 oz (136.7 kg)   SpO2 98%   BMI 50.16 kg/m    Objective:   Physical Exam  Constitutional: She is oriented to person, place, and time. She appears well-developed and well-nourished.  Cardiovascular: Normal rate, regular rhythm and normal heart sounds.  No murmur heard. Pulmonary/Chest: Effort normal. No respiratory distress. She has decreased breath sounds in the right lower field. She has no wheezes.  Neurological: She is alert and oriented to person, place, and time.  Skin: Skin is warm and dry.  Psychiatric: She has a normal mood and affect. Her behavior is normal. Judgment and thought content normal.          Assessment & Plan:  Bronchitis- decreased breath sounds RLL. Will rx with azithromycin to cover bronchitis and pneumonia. Requests rx for cough. Rx with cheratussin (reports she has tolerated in the past without nausea/vomitting). She is advised to call if symptoms worsen, if she develops fever, or if symptoms are not  improved in 3-4 days.  Pt verbalizes understanding.

## 2017-12-16 NOTE — Patient Instructions (Signed)
Begin azithromycin (antibiotic). You may use cheratussin as needed for cough, but do not drive after taking due to possible drowsiness. Call if symptoms worsen, if you develop fever, or if your symptoms are not improved in 3-4 days.

## 2018-01-06 ENCOUNTER — Encounter: Payer: BC Managed Care – PPO | Admitting: Internal Medicine

## 2018-01-18 ENCOUNTER — Ambulatory Visit (INDEPENDENT_AMBULATORY_CARE_PROVIDER_SITE_OTHER): Payer: BC Managed Care – PPO

## 2018-01-18 DIAGNOSIS — Z23 Encounter for immunization: Secondary | ICD-10-CM | POA: Diagnosis not present

## 2018-01-18 NOTE — Progress Notes (Signed)
Patient came into the office to receive her flu vaccine. Patient received the vaccine in her left deltoid & tolerated the injection well. No signs/symptoms of a reaction & VIS given to patient.

## 2018-02-24 ENCOUNTER — Encounter: Payer: Self-pay | Admitting: Internal Medicine

## 2018-02-27 ENCOUNTER — Encounter: Payer: Self-pay | Admitting: Internal Medicine

## 2018-02-27 ENCOUNTER — Ambulatory Visit: Payer: BC Managed Care – PPO | Admitting: Internal Medicine

## 2018-02-27 VITALS — BP 126/82 | HR 72 | Temp 98.2°F | Resp 16 | Ht 65.0 in | Wt 297.4 lb

## 2018-02-27 DIAGNOSIS — J4521 Mild intermittent asthma with (acute) exacerbation: Secondary | ICD-10-CM | POA: Diagnosis not present

## 2018-02-27 DIAGNOSIS — E119 Type 2 diabetes mellitus without complications: Secondary | ICD-10-CM

## 2018-02-27 LAB — BASIC METABOLIC PANEL
BUN: 12 mg/dL (ref 6–23)
CALCIUM: 9.2 mg/dL (ref 8.4–10.5)
CO2: 29 mEq/L (ref 19–32)
Chloride: 104 mEq/L (ref 96–112)
Creatinine, Ser: 0.85 mg/dL (ref 0.40–1.20)
GFR: 92.82 mL/min (ref >60.00)
Glucose, Bld: 103 mg/dL — ABNORMAL HIGH (ref 70–99)
POTASSIUM: 4.7 meq/L (ref 3.5–5.1)
SODIUM: 140 meq/L (ref 135–145)

## 2018-02-27 LAB — HEMOGLOBIN A1C: HEMOGLOBIN A1C: 6.2 % (ref 4.6–6.5)

## 2018-02-27 LAB — POCT GLUCOSE (DEVICE FOR HOME USE): POC GLUCOSE: 236 mg/dL — AB (ref 70–99)

## 2018-02-27 MED ORDER — BUDESONIDE-FORMOTEROL FUMARATE 160-4.5 MCG/ACT IN AERO: 2.0000 | INHALATION_SPRAY | Freq: Two times a day (BID) | RESPIRATORY_TRACT | 3 refills | Status: DC

## 2018-02-27 MED ORDER — GUAIFENESIN-CODEINE 100-10 MG/5ML PO SYRP: 5.0000 mL | ORAL_SOLUTION | Freq: Every evening | ORAL | 0 refills | Status: DC | PRN

## 2018-02-27 MED ORDER — AZITHROMYCIN 250 MG PO TABS: ORAL_TABLET | ORAL | 0 refills | Status: DC

## 2018-02-27 MED FILL — AZITHROMYCIN 250 MG TABLET: 250 | 5 days supply | Qty: 6 | Fill #0

## 2018-02-27 MED FILL — GUAIATUSSIN AC LIQUID: 100-10 | 15 days supply | Qty: 75 | Fill #0

## 2018-02-27 MED FILL — SYMBICORT 160-4.5 MCG INH: 160-4.5 | 30 days supply | Qty: 10 | Fill #0

## 2018-02-27 NOTE — Progress Notes (Signed)
Pre visit review using our clinic review tool, if applicable. No additional management support is needed unless otherwise documented below in the visit note. 

## 2018-02-27 NOTE — Assessment & Plan Note (Signed)
Last physical exam was 04/2016 otherwise she does mostly acute visits. Asthma exacerbation: Continue albuterol, start Symbicort.  If no better start a Z-Pak.  I am reluctant to prescribe oral steroids.  Request a small amount of codeine which helped before.  Sent. DM: CBG 236. today check a BMP and A1c Morbid obesity: Recommend routine visits RTC at her convenience for routine care.

## 2018-02-27 NOTE — Progress Notes (Signed)
Subjective:    Patient ID: Lauren Johns, female    DOB: [birth year], 47 y.o.   MRN: [id number]  DOS:  02/27/2018 Type of visit - description : acute  Interval history: One-week history of cough, wheezing, chest congestion, postnasal dripping. He is taking albuterol inhalers and Mucinex. Prior to the symptoms, asthma was not a major problem.   Review of Systems Denies fever chills No sputum production despite feeling a lot of "mucus in my chest"  Past Medical History:  Diagnosis Date  . Abnormal brain MRI   . Allergic rhinitis 05/21/2013  . Arthritis   . Asthma   . Chronic kidney disease   . Depression    h/o   . Diabetes ([place]) 04/03/2013  . Elevated lipids   . GERD (gastroesophageal reflux disease)   . Infertility, female    h/o  . Insomnia   . Obesity   . PFO (patent foramen ovale)    ??PFO repair:  in the 90s, was seen several times after and released (per patient)    Past Surgical History:  Procedure Laterality Date  . closed hearty valve repair  [place] , PFO repair??    Social History   Socioeconomic History  . Marital status: Married    Spouse name: Not on file  . Number of children: 0  . Years of education: Not on file  . Highest education level: Not on file  Occupational History  . Occupation: Education officer, museum -support  Social Needs  . Financial resource strain: Not on file  . Food insecurity:    Worry: Not on file    Inability: Not on file  . Transportation needs:    Medical: Not on file    Non-medical: Not on file  Tobacco Use  . Smoking status: Never Smoker  . Smokeless tobacco: Never Used  Substance and Sexual Activity  . Alcohol use: Yes    Comment: socially   . Drug use: No  . Sexual activity: Not on file  Lifestyle  . Physical activity:    Days per week: Not on file    Minutes per session: Not on file  . Stress: Not on file  Relationships  . Social connections:    Talks on phone: Not on file    Gets together: Not on  file    Attends religious service: Not on file    Active member of club or organization: Not on file    Attends meetings of clubs or organizations: Not on file    Relationship status: Not on file  . Intimate partner violence:    Fear of current or ex partner: Not on file    Emotionally abused: Not on file    Physically abused: Not on file    Forced sexual activity: Not on file  Other Topics Concern  . Not on file  Social History Narrative   mother is Elodia Florence, one of my patients    Lives w/ husband      Allergies as of 02/27/2018      Reactions   Contrave [naltrexone-bupropion Hcl Er]    Multiple s/e, hot flashes, back pain, lethargic   Gadolinium Derivatives Itching, Rash   Worsening symptoms 30 min after oral benadryl.  Pt was referred to ED by Dr. Anselm Pancoast.   Metformin And Related    Generalize itching, rash (arms)   Oxycodone-acetaminophen Nausea And Vomiting   Penicillins Hives   Has patient had a PCN reaction causing immediate  rash, facial/tongue/throat swelling, SOB or lightheadedness with hypotension:unsure Has patient had a PCN reaction causing severe rash involving mucus membranes or skin necrosis:unsure Has patient had a PCN reaction that required hospitalization:No Has patient had a PCN reaction occurring within the last 10 years:No If all of the above answers are "NO", then may proceed with Cephalosporin use. Childhood reaction   Contrast Media [iodinated Diagnostic Agents] Itching, Rash      Medication List        Accurate as of 02/27/18  7:44 PM. Always use your most recent med list.          acetaminophen 500 MG tablet Commonly known as:  TYLENOL Take 1,000 mg by mouth every 6 (six) hours as needed.   albuterol (2.5 MG/3ML) 0.083% nebulizer solution Commonly known as:  PROVENTIL Take 3 mLs (2.5 mg total) by nebulization every 6 (six) hours as needed for wheezing or shortness of breath.   albuterol 108 (90 Base) MCG/ACT inhaler Commonly known as:   PROVENTIL HFA;VENTOLIN HFA Inhale 1 puff into the lungs every 6 (six) hours as needed for wheezing or shortness of breath.   azelastine 0.1 % nasal spray Commonly known as:  ASTELIN Place 2 sprays into both nostrils at bedtime as needed for rhinitis. Use in each nostril as directed   azithromycin 250 MG tablet Commonly known as:  ZITHROMAX 2 tabs a day the first day, then 1 tab a day x 4 days   budesonide-formoterol 160-4.5 MCG/ACT inhaler Commonly known as:  SYMBICORT Inhale 2 puffs into the lungs 2 (two) times daily.   fluticasone 50 MCG/ACT nasal spray Commonly known as:  FLONASE Place 2 sprays into both nostrils daily.   guaiFENesin-codeine 100-10 MG/5ML syrup Commonly known as:  ROBITUSSIN AC Take 5 mLs by mouth at bedtime as needed for cough.   multivitamin with minerals Tabs tablet Take 1 tablet by mouth daily. ALIVE          Objective:   Physical Exam BP 126/82 (BP Location: Right Wrist, Patient Position: Sitting, Cuff Size: Small)   Pulse 72   Temp 98.2 F (36.8 C) (Oral)   Resp 16   Ht 5\' 5"  (1.651 m)   Wt 297 lb 6 oz (134.9 kg)   SpO2 96%   BMI 49.49 kg/m      General:   Well developed, NAD, BMI noted. HEENT:  Normocephalic . Face symmetric, atraumatic. Left TM slightly bulge, right TM normal Nose congested, sinuses no TTP.  Throat symmetric, no red Lungs:  Bilateral few wheezing and few rhonchi with cough.  No distress Normal respiratory effort, no intercostal retractions, no accessory muscle use. Heart: RRR,  no murmur.  No pretibial edema bilaterally  Skin: Not pale. Not jaundice Neurologic:  alert & oriented X3.  Speech normal, gait appropriate for age and unassisted Psych--  Cognition and judgment appear intact.  Cooperative with normal attention span and concentration.  Behavior appropriate. No anxious or depressed appearing.   Assessment & Plan:  Assessment   DM aic 6.6 (02-2014) Hyperlipidemia Asthma Insomnia GERD Morbid  obesity  Weight: 2008 281 pounds, 2009 275 pounds,  , 10/22/2011 293  Int hemorrhoids dx 05-2015  H/o abnormal brain MRI-- pituitary adenoma used to see Dr Trenton Gammon H/o  depression Birth control --> H/o infertility  H/o close heart surgery at [place], PFO repair?Echo 10-2011: wnl  PLAN: Last physical exam was 04/2016 otherwise she does mostly acute visits. Asthma exacerbation: Continue albuterol, start Symbicort.  If no better  start a Z-Pak.  I am reluctant to prescribe oral steroids.  Request a small amount of codeine which helped before.  Sent. DM: CBG 236. today check a BMP and A1c Morbid obesity: Recommend routine visits RTC at her convenience for routine care.

## 2018-02-27 NOTE — Patient Instructions (Signed)
GO TO THE LAB : Get the blood work     GO TO THE FRONT DESK Schedule your next appointment for a routine checkup at your convenience  =====   Rest, fluids , tylenol  For cough:  Take Mucinex DM twice a day as needed until better  For nasal congestion: Use OTC Nasocort or Flonase : 2 nasal sprays on each side of the nose in the morning until you feel better  For asthma: Start Symbicort 2 puffs  twice a day Continue with albuterol as needed for cough and chest congestion  Take the antibiotic as prescribed  (Zithromax) only if not improving in the next 3 to 4 days   Call if not gradually better over the next  10 days  Call anytime if the symptoms are severe

## 2018-03-14 ENCOUNTER — Encounter: Payer: Self-pay | Admitting: Internal Medicine

## 2018-04-13 ENCOUNTER — Encounter: Payer: Self-pay | Admitting: Internal Medicine

## 2018-04-13 ENCOUNTER — Ambulatory Visit (INDEPENDENT_AMBULATORY_CARE_PROVIDER_SITE_OTHER): Payer: BC Managed Care – PPO | Admitting: Internal Medicine

## 2018-04-13 VITALS — BP 126/64 | HR 49 | Temp 98.6°F | Resp 16 | Ht 65.0 in | Wt 290.5 lb

## 2018-04-13 DIAGNOSIS — Z23 Encounter for immunization: Secondary | ICD-10-CM | POA: Diagnosis not present

## 2018-04-13 DIAGNOSIS — Z0000 Encounter for general adult medical examination without abnormal findings: Secondary | ICD-10-CM

## 2018-04-13 DIAGNOSIS — E119 Type 2 diabetes mellitus without complications: Secondary | ICD-10-CM | POA: Diagnosis not present

## 2018-04-13 LAB — CBC WITH DIFFERENTIAL/PLATELET
Basophils Absolute: 0 10*3/uL (ref 0.0–0.1)
Basophils Relative: 0.5 % (ref 0.0–3.0)
EOS PCT: 2.5 % (ref 0.0–5.0)
Eosinophils Absolute: 0.1 10*3/uL (ref 0.0–0.7)
HCT: 36.5 % (ref 36.0–46.0)
Hemoglobin: 12 g/dL (ref 12.0–15.0)
LYMPHS ABS: 1 10*3/uL (ref 0.7–4.0)
Lymphocytes Relative: 27.1 % (ref 12.0–46.0)
MCHC: 32.9 g/dL (ref 30.0–36.0)
MCV: 93 fl (ref 78.0–100.0)
MONOS PCT: 12.9 % — AB (ref 3.0–12.0)
Monocytes Absolute: 0.5 10*3/uL (ref 0.1–1.0)
NEUTROS ABS: 2.2 10*3/uL (ref 1.4–7.7)
NEUTROS PCT: 57 % (ref 43.0–77.0)
PLATELETS: 290 10*3/uL (ref 150.0–400.0)
RBC: 3.92 Mil/uL (ref 3.87–5.11)
RDW: 14.6 % (ref 11.5–15.5)
WBC: 3.8 10*3/uL — ABNORMAL LOW (ref 4.0–10.5)

## 2018-04-13 LAB — LIPID PANEL
CHOLESTEROL: 222 mg/dL — AB (ref 0–200)
HDL: 42.1 mg/dL (ref >39.00)
LDL Cholesterol: 157 mg/dL — ABNORMAL HIGH (ref 0–99)
NonHDL: 179.92
TRIGLYCERIDES: 115 mg/dL (ref 0.0–149.0)
Total CHOL/HDL Ratio: 5
VLDL: 23 mg/dL (ref 0.0–40.0)

## 2018-04-13 LAB — MICROALBUMIN / CREATININE URINE RATIO
Creatinine,U: 175.1 mg/dL
MICROALB/CREAT RATIO: 0.4 mg/g (ref 0.0–30.0)
Microalb, Ur: <0.7 mg/dL (ref 0.0–1.9)

## 2018-04-13 LAB — AST: AST: 17 U/L (ref 0–37)

## 2018-04-13 LAB — ALT: ALT: 13 U/L (ref 0–35)

## 2018-04-13 LAB — TSH: TSH: 1.48 u[IU]/mL (ref 0.35–4.50)

## 2018-04-13 MED ORDER — PREDNISONE 10 MG PO TABS: ORAL_TABLET | ORAL | 0 refills | Status: DC

## 2018-04-13 MED FILL — predniSONE 10 MG TABS: 10 | 6 days supply | Qty: 12 | Fill #0

## 2018-04-13 NOTE — Assessment & Plan Note (Signed)
DM: Last A1c Was 6.2. Hyperlipidemia: Checking labs, currently on no medications. Asthma: Slightly increased symptoms, the patient uses albuterol less than 3 times a week, recommend to discuss with her allergist, has an appointment 05/01/2018. Morbid obesity: Doing great, lost 11 pounds, doing weight watchers. Plantar fasciitis: Stretching discussed, ? shoe insert, if not better let me know Back pain with some radicular features, with symmetric neuro exam.  Recommend heating pad, Tylenol and low-dose round of prednisone. RTC 6 months

## 2018-04-13 NOTE — Progress Notes (Signed)
Subjective:    Patient ID: Lauren Johns, female    DOB: 1971/03/08, 48 y.o.   MRN: [id number]  DOS:  04/13/2018 Type of visit - description:  cpx In general feeling well. Has lost 11 pounds by simply watching her diet Mild wheezing, slightly worse than baseline, no URI type of symptoms per se, she believes increased symptoms are due to the cold weather. One-week history of left back pain with some radiation to the left lateral thigh.  No lower extremity paresthesias. Also, bilateral heel pain, moderate to severe at times.  History of plantar fasciitis, thinks is the same.  This is becoming more of a chronic issue.  Wt Readings from Last 3 Encounters:  04/13/18 290 lb 8 oz (131.8 kg)  02/27/18 297 lb 6 oz (134.9 kg)  12/16/17 (!) 301 lb 6.4 oz (136.7 kg)    Review of Systems  Other than above, a 14 point review of systems is negative   Past Medical History:  Diagnosis Date  . Abnormal brain MRI   . Allergic rhinitis 05/21/2013  . Arthritis   . Asthma   . Chronic kidney disease   . Depression    h/o   . Diabetes ([place]) 04/03/2013  . Elevated lipids   . GERD (gastroesophageal reflux disease)   . Infertility, female    h/o  . Insomnia   . Obesity   . PFO (patent foramen ovale)    ??PFO repair:  in the 90s, was seen several times after and released (per patient)    Past Surgical History:  Procedure Laterality Date  . closed hearty valve repair  [place] , PFO repair??    Social History   Socioeconomic History  . Marital status: Married    Spouse name: Not on file  . Number of children: 0  . Years of education: Not on file  . Highest education level: Not on file  Occupational History  . Occupation: Education officer, museum -support  Social Needs  . Financial resource strain: Not on file  . Food insecurity:    Worry: Not on file    Inability: Not on file  . Transportation needs:    Medical: Not on file    Non-medical: Not on file  Tobacco Use  . Smoking  status: Never Smoker  . Smokeless tobacco: Never Used  Substance and Sexual Activity  . Alcohol use: Yes    Comment: socially   . Drug use: No  . Sexual activity: Not on file  Lifestyle  . Physical activity:    Days per week: Not on file    Minutes per session: Not on file  . Stress: Not on file  Relationships  . Social connections:    Talks on phone: Not on file    Gets together: Not on file    Attends religious service: Not on file    Active member of club or organization: Not on file    Attends meetings of clubs or organizations: Not on file    Relationship status: Not on file  . Intimate partner violence:    Fear of current or ex partner: Not on file    Emotionally abused: Not on file    Physically abused: Not on file    Forced sexual activity: Not on file  Other Topics Concern  . Not on file  Social History Narrative   mother is Elodia Florence, one of my patients    Husband and pt moving in w/  [facility]     Family History  Problem Relation Age of Onset  . Heart attack Father 47  . Diabetes Mother   . Hypertension Mother        M, F  . Prostate cancer Maternal Grandfather   . Cancer Maternal Grandfather        late in life   . Breast cancer Neg Hx   . Colon cancer Neg Hx      Allergies as of 04/13/2018      Reactions   Contrave [naltrexone-bupropion Hcl Er]    Multiple s/e, hot flashes, back pain, lethargic   Gadolinium Derivatives Itching, Rash   Worsening symptoms 30 min after oral benadryl.  Pt was referred to ED by Dr. Anselm Pancoast.   Metformin And Related    Generalize itching, rash (arms)   Oxycodone-acetaminophen Nausea And Vomiting   Penicillins Hives   Has patient had a PCN reaction causing immediate rash, facial/tongue/throat swelling, SOB or lightheadedness with hypotension:unsure Has patient had a PCN reaction causing severe rash involving mucus membranes or skin necrosis:unsure Has patient had a PCN reaction that required hospitalization:No Has patient  had a PCN reaction occurring within the last 10 years:No If all of the above answers are "NO", then may proceed with Cephalosporin use. Childhood reaction   Contrast Media [iodinated Diagnostic Agents] Itching, Rash      Medication List       Accurate as of April 13, 2018  1:58 PM. Always use your most recent med list.        acetaminophen 500 MG tablet Commonly known as:  TYLENOL Take 1,000 mg by mouth every 6 (six) hours as needed.   albuterol (2.5 MG/3ML) 0.083% nebulizer solution Commonly known as:  PROVENTIL Take 3 mLs (2.5 mg total) by nebulization every 6 (six) hours as needed for wheezing or shortness of breath.   albuterol 108 (90 Base) MCG/ACT inhaler Commonly known as:  VENTOLIN HFA Inhale 1 puff into the lungs every 6 (six) hours as needed for wheezing or shortness of breath.   azelastine 0.1 % nasal spray Commonly known as:  ASTELIN Place 2 sprays into both nostrils at bedtime as needed for rhinitis. Use in each nostril as directed   budesonide-formoterol 160-4.5 MCG/ACT inhaler Commonly known as:  SYMBICORT Inhale 2 puffs into the lungs 2 (two) times daily.   fluticasone 50 MCG/ACT nasal spray Commonly known as:  FLONASE Place 2 sprays into both nostrils daily.   multivitamin with minerals Tabs tablet Take 1 tablet by mouth daily. ALIVE   predniSONE 10 MG tablet Commonly known as:  DELTASONE 3 tabs x 2 days, 2 tabs x 2 days, 1 tab x 2 days           Objective:   Physical Exam BP 126/64 (BP Location: Left Wrist, Patient Position: Sitting, Cuff Size: Small)   Pulse (!) 49   Temp 98.6 F (37 C) (Oral)   Resp 16   Ht 5\' 5"  (1.651 m)   Wt 290 lb 8 oz (131.8 kg)   SpO2 98%   BMI 48.34 kg/m  General: Well developed, NAD, BMI noted Neck: No  thyromegaly  HEENT:  Normocephalic . Face symmetric, atraumatic Lungs:  CTA B Normal respiratory effort, no intercostal retractions, no accessory muscle use. Heart: RRR,  no murmur.  No pretibial edema  bilaterally  Abdomen:  Not distended, soft, non-tender. No rebound or rigidity.   Skin: Exposed areas without rash. Not pale. Not jaundice MSK: Slightly TTP at  the plantar aspect of both heels.  Mild flat foot noted Neurologic:  alert & oriented X3.  Speech normal, gait appropriate for age and unassisted Strength symmetric and appropriate for age.Marland Kitchen DTRs: Symmetric, normal in the upper extremities, symmetrically decreased in the lower extremities.  Straight leg test negative. Psych: Cognition and judgment appear intact.  Cooperative with normal attention span and concentration.  Behavior appropriate. No anxious or depressed appearing.     Assessment     Assessment   DM aic 6.6 (02-2014) Hyperlipidemia Asthma Insomnia GERD Morbid obesity  Weight: 2008 281 pounds, 2009 275 pounds,  , 10/22/2011 293  Int hemorrhoids dx 05-2015  H/o abnormal brain MRI-- pituitary adenoma used to see Dr Trenton Gammon H/o  depression Birth control --> H/o infertility  H/o close heart surgery at [facility], PFO repair?Echo 10-2011: wnl  PLAN: DM: Last A1c Was 6.2. Hyperlipidemia: Checking labs, currently on no medications. Asthma: Slightly increased symptoms, the patient uses albuterol less than 3 times a week, recommend to discuss with her allergist, has an appointment 05/01/2018. Morbid obesity: Doing great, lost 11 pounds, doing weight watchers. Plantar fasciitis: Stretching discussed, ? shoe insert, if not better let me know Back pain with some radicular features, with symmetric neuro exam.  Recommend heating pad, Tylenol and low-dose round of prednisone. RTC 6 months

## 2018-04-13 NOTE — Assessment & Plan Note (Signed)
Td 09; pnm 23:2016 ; had a Flu shot  Sees gyn Labs: AST, ALT, FLP, CBC, TSH, micro Diet exercise discussed

## 2018-04-13 NOTE — Patient Instructions (Signed)
GO TO THE LAB : Get the blood work     GO TO THE FRONT DESK Schedule your next appointment for a checkup in 6 months  Weight watchers is working for you !!  Stretch your Achilles tendon and feet twice a day  For back pain: Heating pad twice a day Tylenol as needed Prednisone for few days Call if not gradually better

## 2018-04-13 NOTE — Progress Notes (Signed)
Pre visit review using our clinic review tool, if applicable. No additional management support is needed unless otherwise documented below in the visit note. 

## 2018-04-20 ENCOUNTER — Other Ambulatory Visit: Payer: Self-pay | Admitting: Internal Medicine

## 2018-04-20 MED ORDER — ALBUTEROL SULFATE (2.5 MG/3ML) 0.083% IN NEBU: 2.5000 mg | INHALATION_SOLUTION | Freq: Four times a day (QID) | RESPIRATORY_TRACT | 0 refills | Status: AC | PRN

## 2018-04-20 NOTE — Telephone Encounter (Signed)
Per last OV- patient to follow up with allergist 05/01/18. Refill granted with no additional. Requested Prescriptions  Pending Prescriptions Disp Refills  . albuterol (PROVENTIL) (2.5 MG/3ML) 0.083% nebulizer solution 150 mL 0    Sig: Take 3 mLs (2.5 mg total) by nebulization every 6 (six) hours as needed for wheezing or shortness of breath.     Pulmonology:  Beta Agonists Failed - 04/20/2018 11:41 AM      Failed - One inhaler should last at least one month. If the patient is requesting refills earlier, contact the patient to check for uncontrolled symptoms.      Passed - Valid encounter within last 12 months    Recent Outpatient Visits          1 week ago Annual physical exam   [facility], MD   1 month ago Type 2 diabetes mellitus without complication, without long-term current use of insulin ([place])   [facility] at [facility], MD   4 months ago [facility] [place], NP   1 year ago Acute maxillary sinusitis, recurrence not specified   [facility] at [place], [place]   1 year ago Type 2 diabetes mellitus without complication, without long-term current use of insulin ([place])   [facility] at [place], MD      Future Appointments            In 6 months Larose Kells, Alda Berthold, MD [facility], [place]

## 2018-04-20 NOTE — Telephone Encounter (Signed)
Copied from [facility] [id number]. Topic: Quick Communication - Rx Refill/Question >> Apr 20, 2018 11:27 AM Alanda Slim E wrote: Medication: albuterol (PROVENTIL) (2.5 MG/3ML) 0.083% nebulizer solution  Pt states Dr. Larose Kells was suppose to send this in when she picked up her other meds on 1.02.2020. Pt needs asap   Has the patient contacted their pharmacy? Yes (no Rx available)   Preferred Pharmacy (with phone number or street name): [place], [place] - [address] [phone] (Phone) [phone] (Fax)    Agent: Please be advised that RX refills may take up to 3 business days. We ask that you follow-up with your pharmacy.

## 2018-08-11 IMAGING — US US EXTREM LOW VENOUS*R*
1 series · 13 of 24 positions shown · non-contrast
Comparison: None.

CLINICAL DATA: Right calf pain for the past 6 days. Evaluate for
DVT.



[Series 1: us extrem low venous*right* · 0.10mm/px · 13 of 26 slices shown]
[im 1/26]
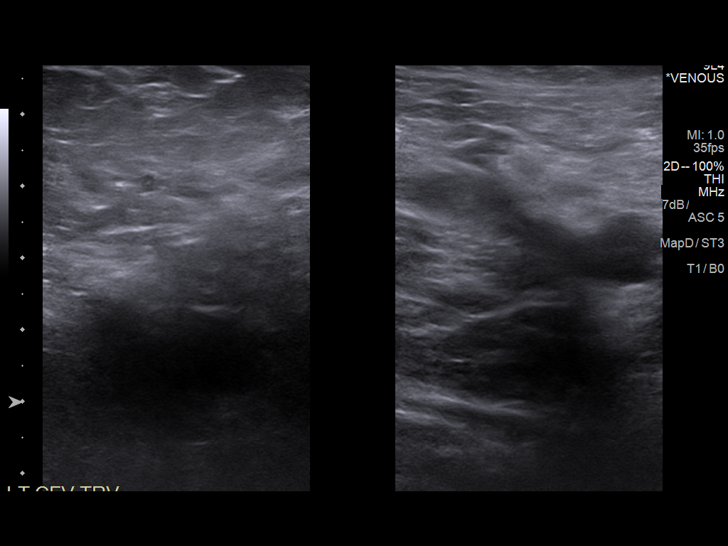
[im 3/26]
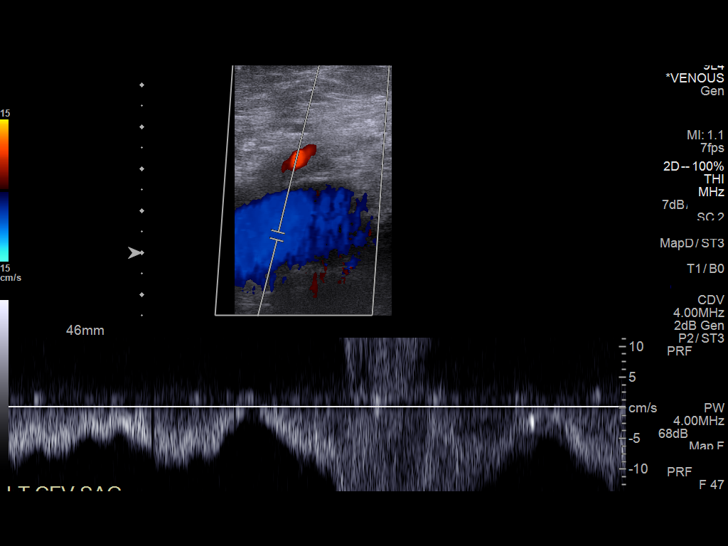
[im 5/26]
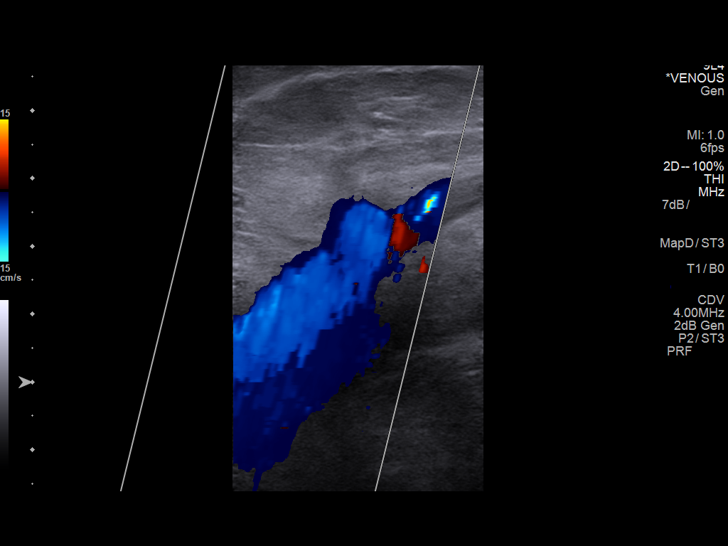
[im 7/26]
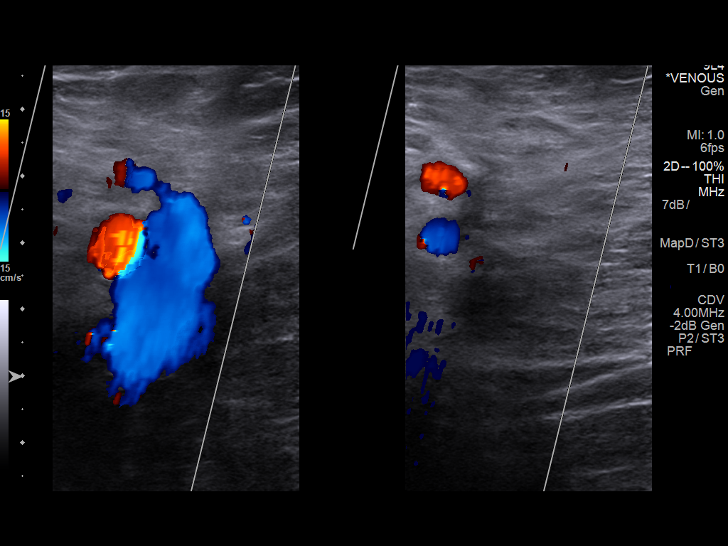
[im 9/26]
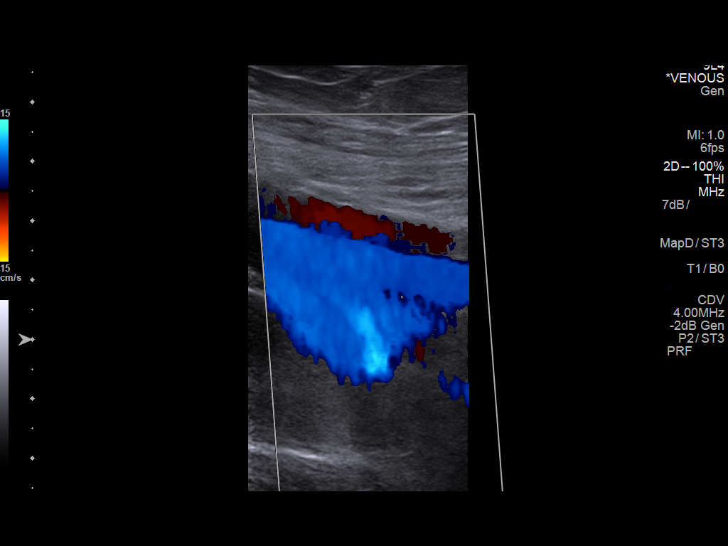
[im 11/26]
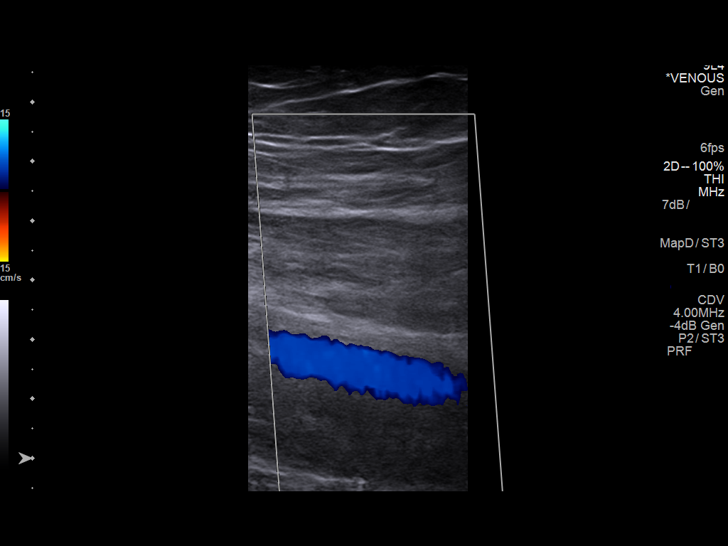
[im 14/26]
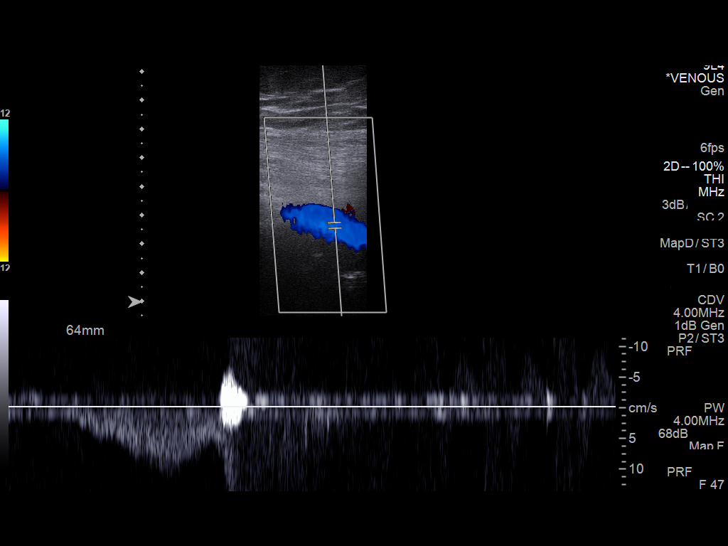
[im 15/26]
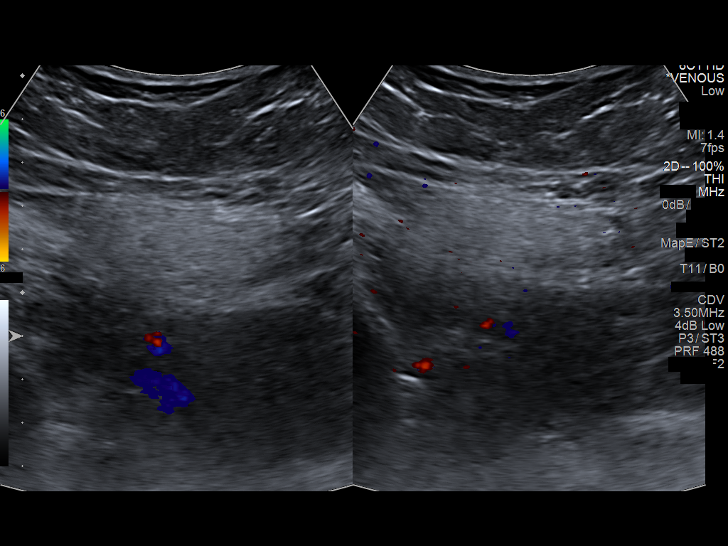
[im 17/26]
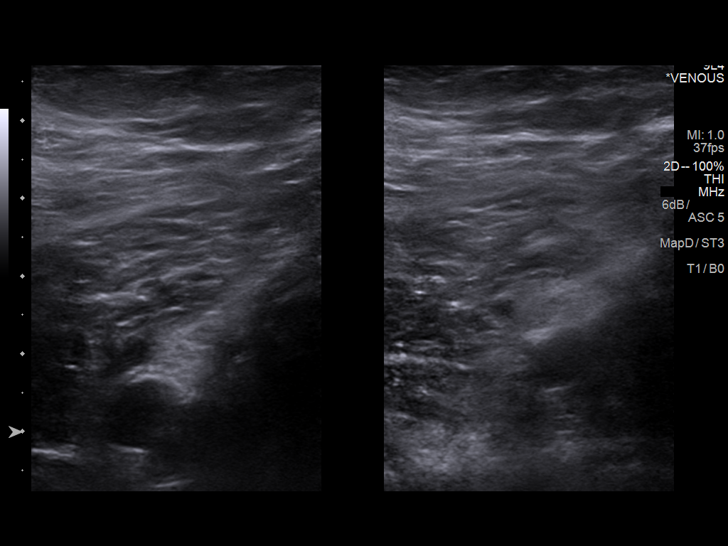
[im 19/26]
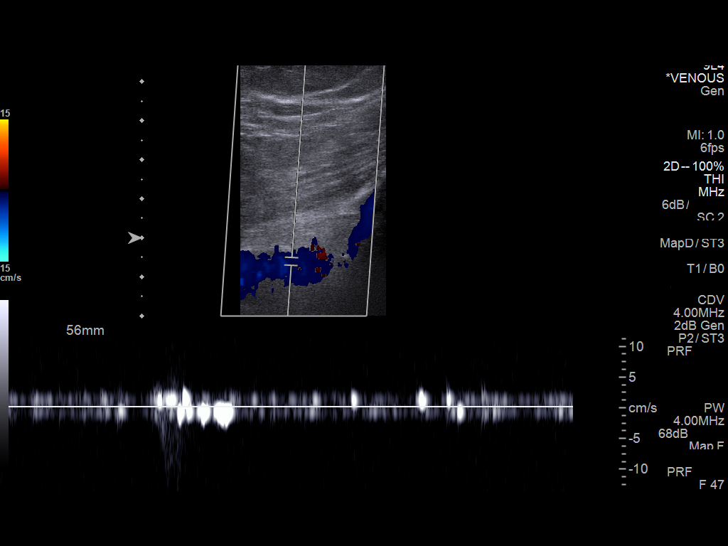
[im 21/26]
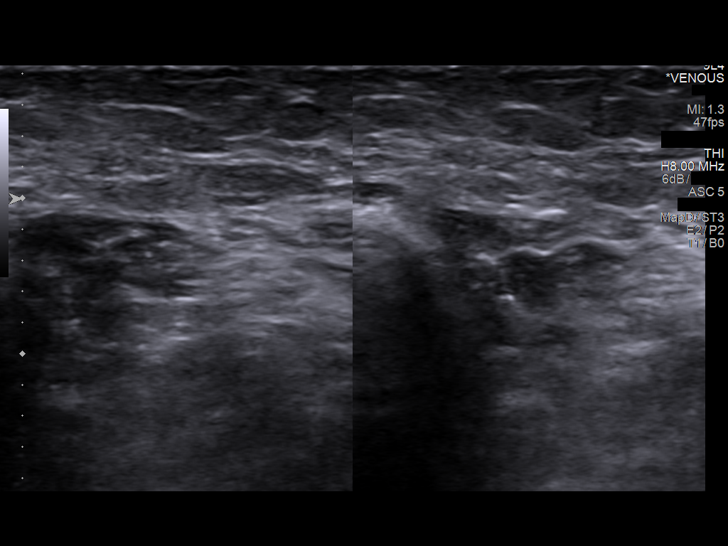
[im 23/26]
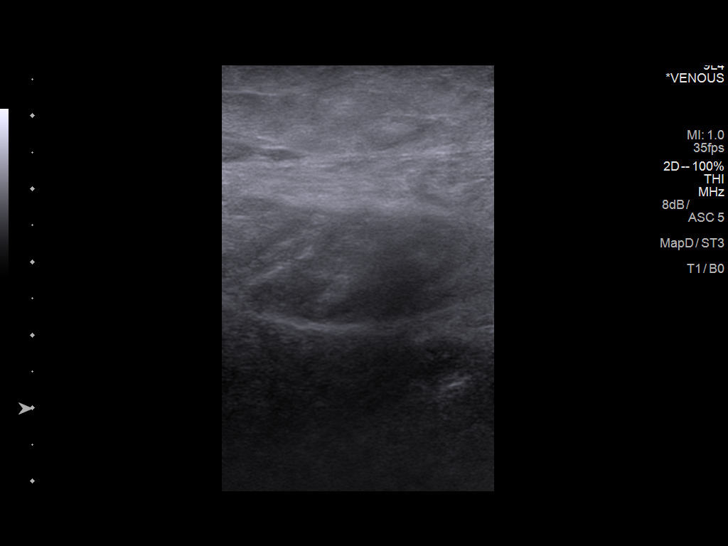
[im 26/26]
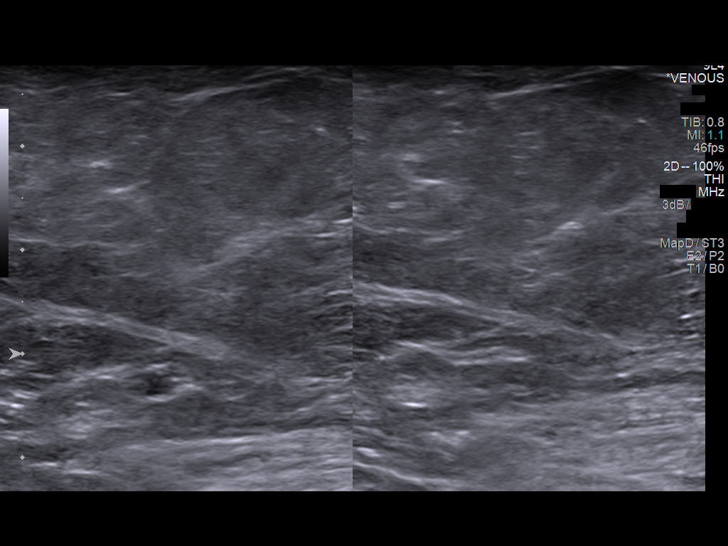

[13 of 24 positions shown; findings below may reference images not displayed]

FINDINGS: Contralateral Common Femoral Vein: Respiratory phasicity is normal
and symmetric with the symptomatic side. No evidence of thrombus.
Normal compressibility.

Common Femoral Vein: No evidence of thrombus. Normal
compressibility, respiratory phasicity and response to augmentation.

Saphenofemoral Junction: No evidence of thrombus. Normal
compressibility and flow on color Doppler imaging.

Profunda Femoral Vein: No evidence of thrombus. Normal
compressibility and flow on color Doppler imaging.

Femoral Vein: No evidence of thrombus. Normal compressibility,
respiratory phasicity and response to augmentation.

Popliteal Vein: No evidence of thrombus. Normal compressibility,
respiratory phasicity and response to augmentation.

Calf Veins: No evidence of thrombus. Normal compressibility and flow
on color Doppler imaging.

Superficial Great Saphenous Vein: No evidence of thrombus. Normal
compressibility and flow on color Doppler imaging.

Venous Reflux:  None.

Other Findings:  None.
IMPRESSION: No evidence of DVT within the right lower extremity.

## 2018-09-16 IMAGING — DX DG KNEE AP/LAT W/ SUNRISE*R*
3 series · 3 of 3 positions shown · non-contrast
Comparison: None

CLINICAL DATA: Chronic RIGHT knee pain, no recent injury

EXAM:
RIGHT KNEE 3 VIEWS

[knee ap]
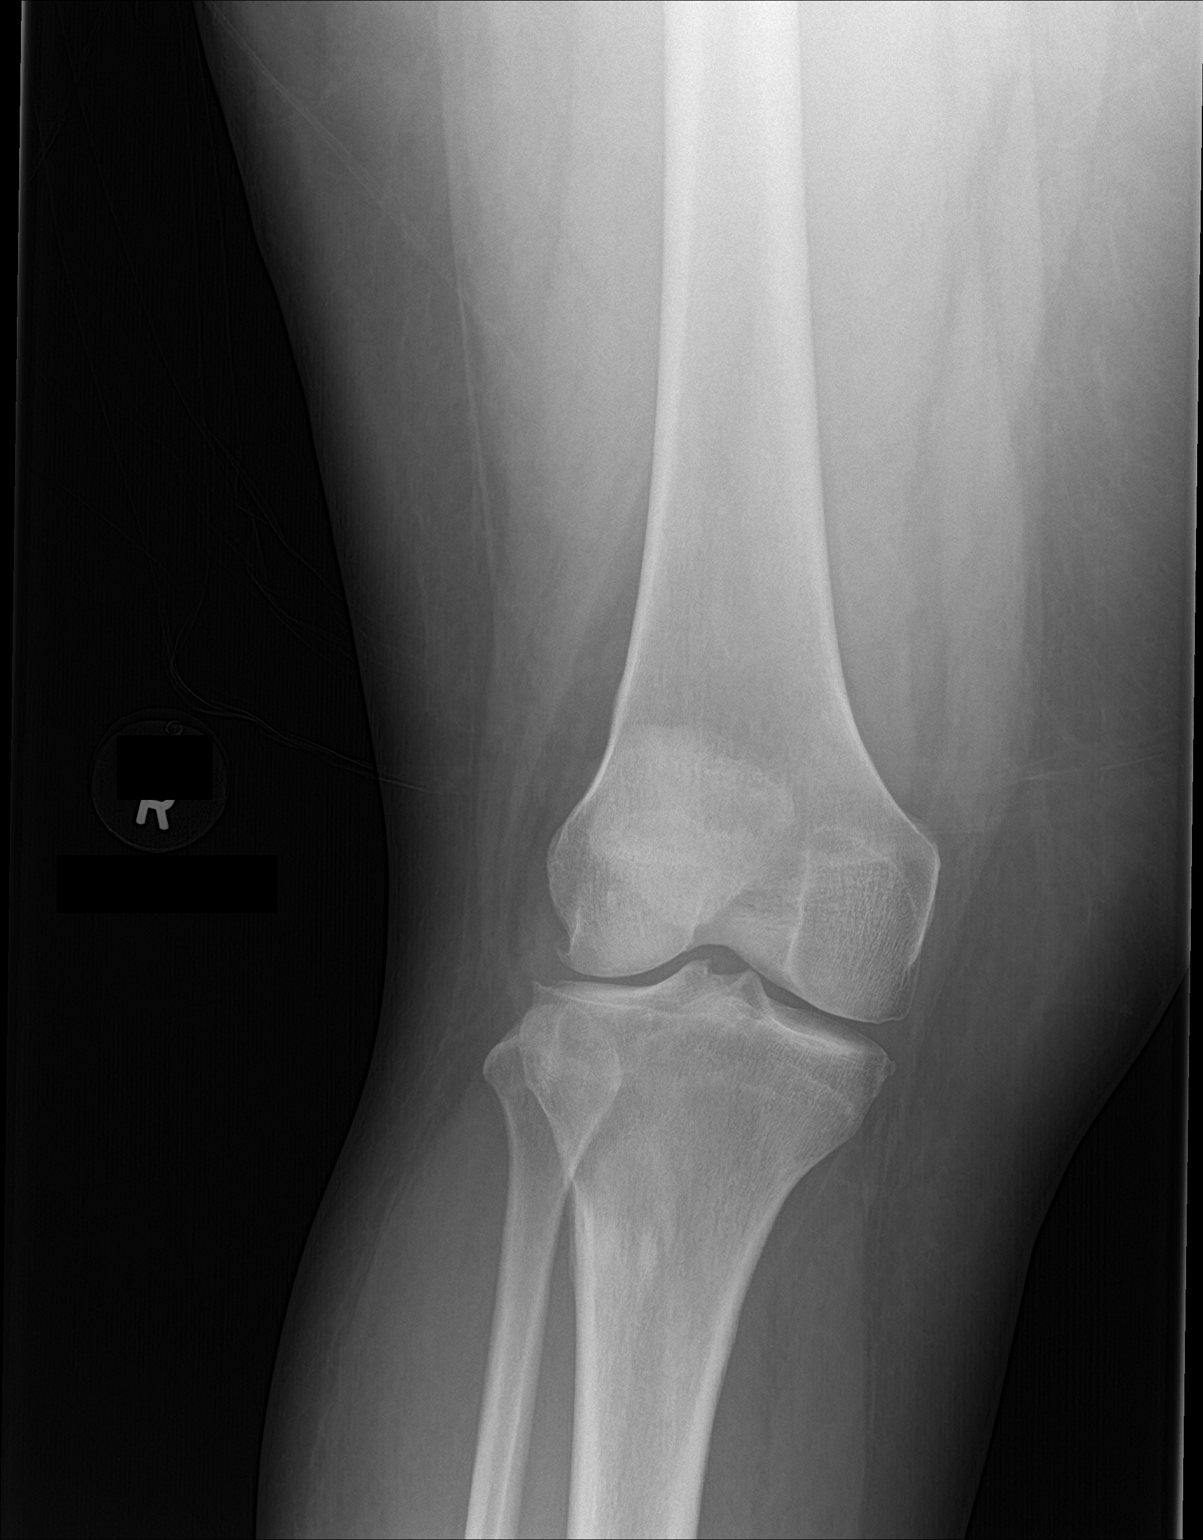

[knee lat]
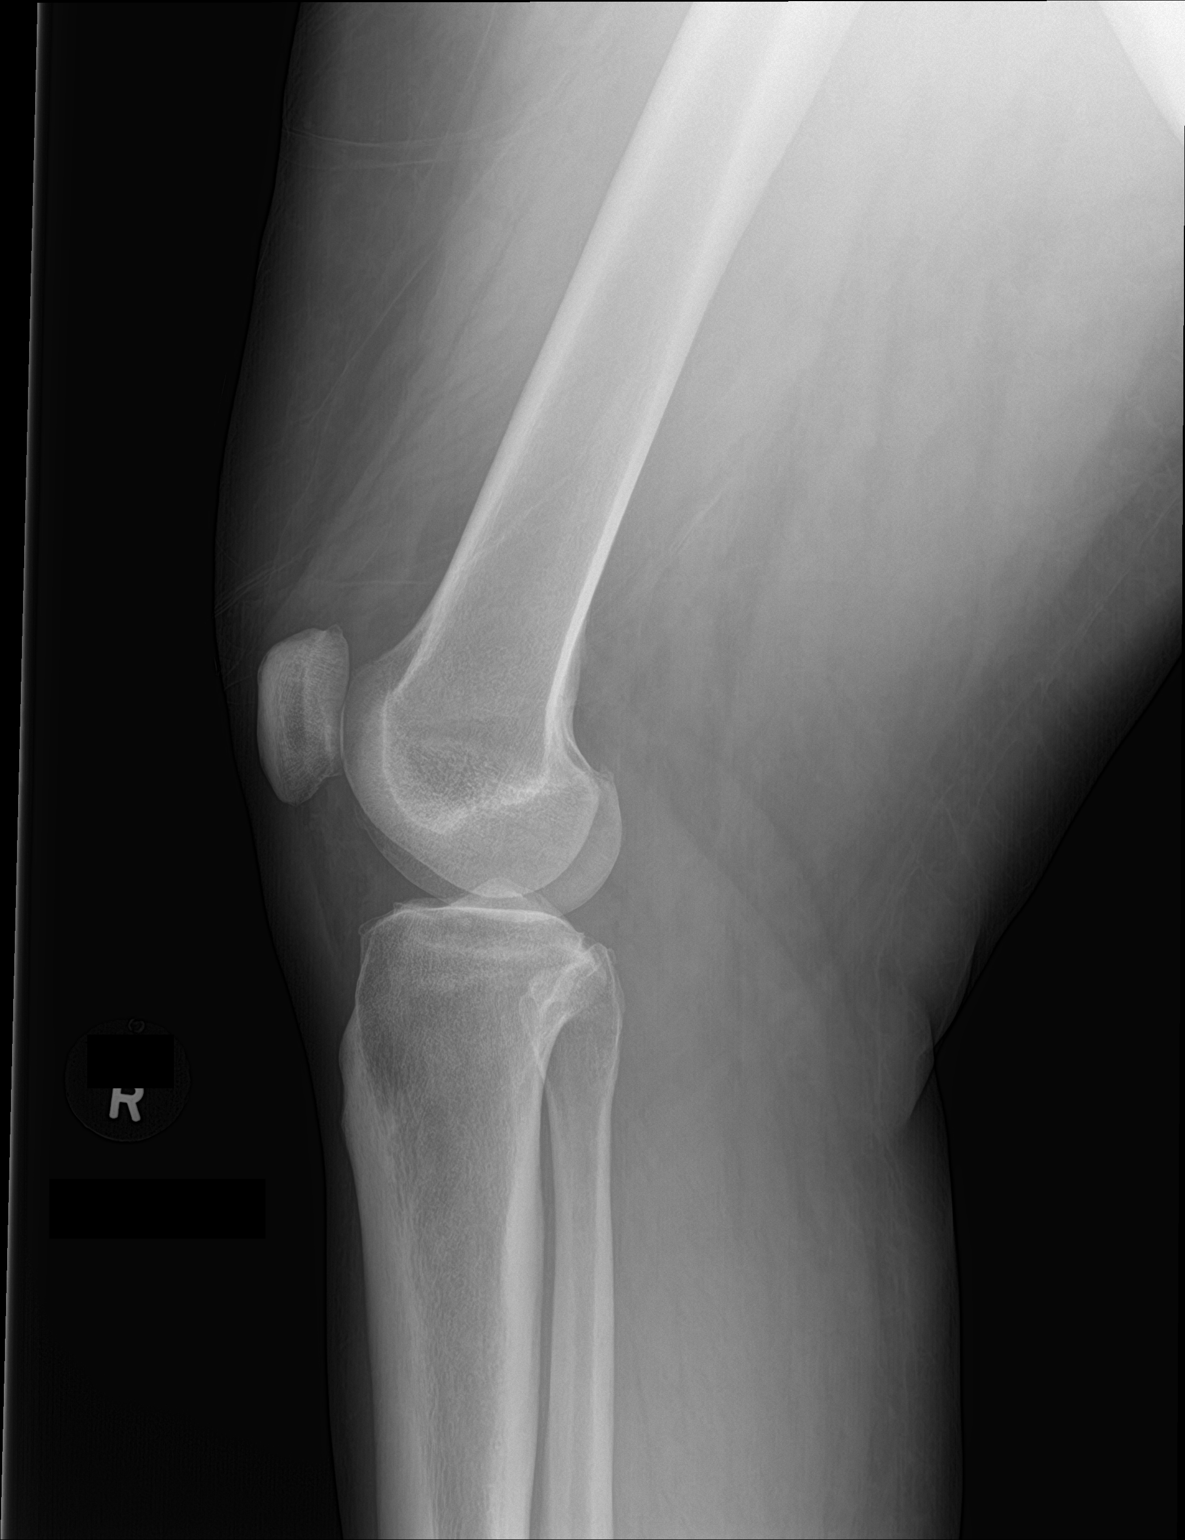

[knee sunrise]
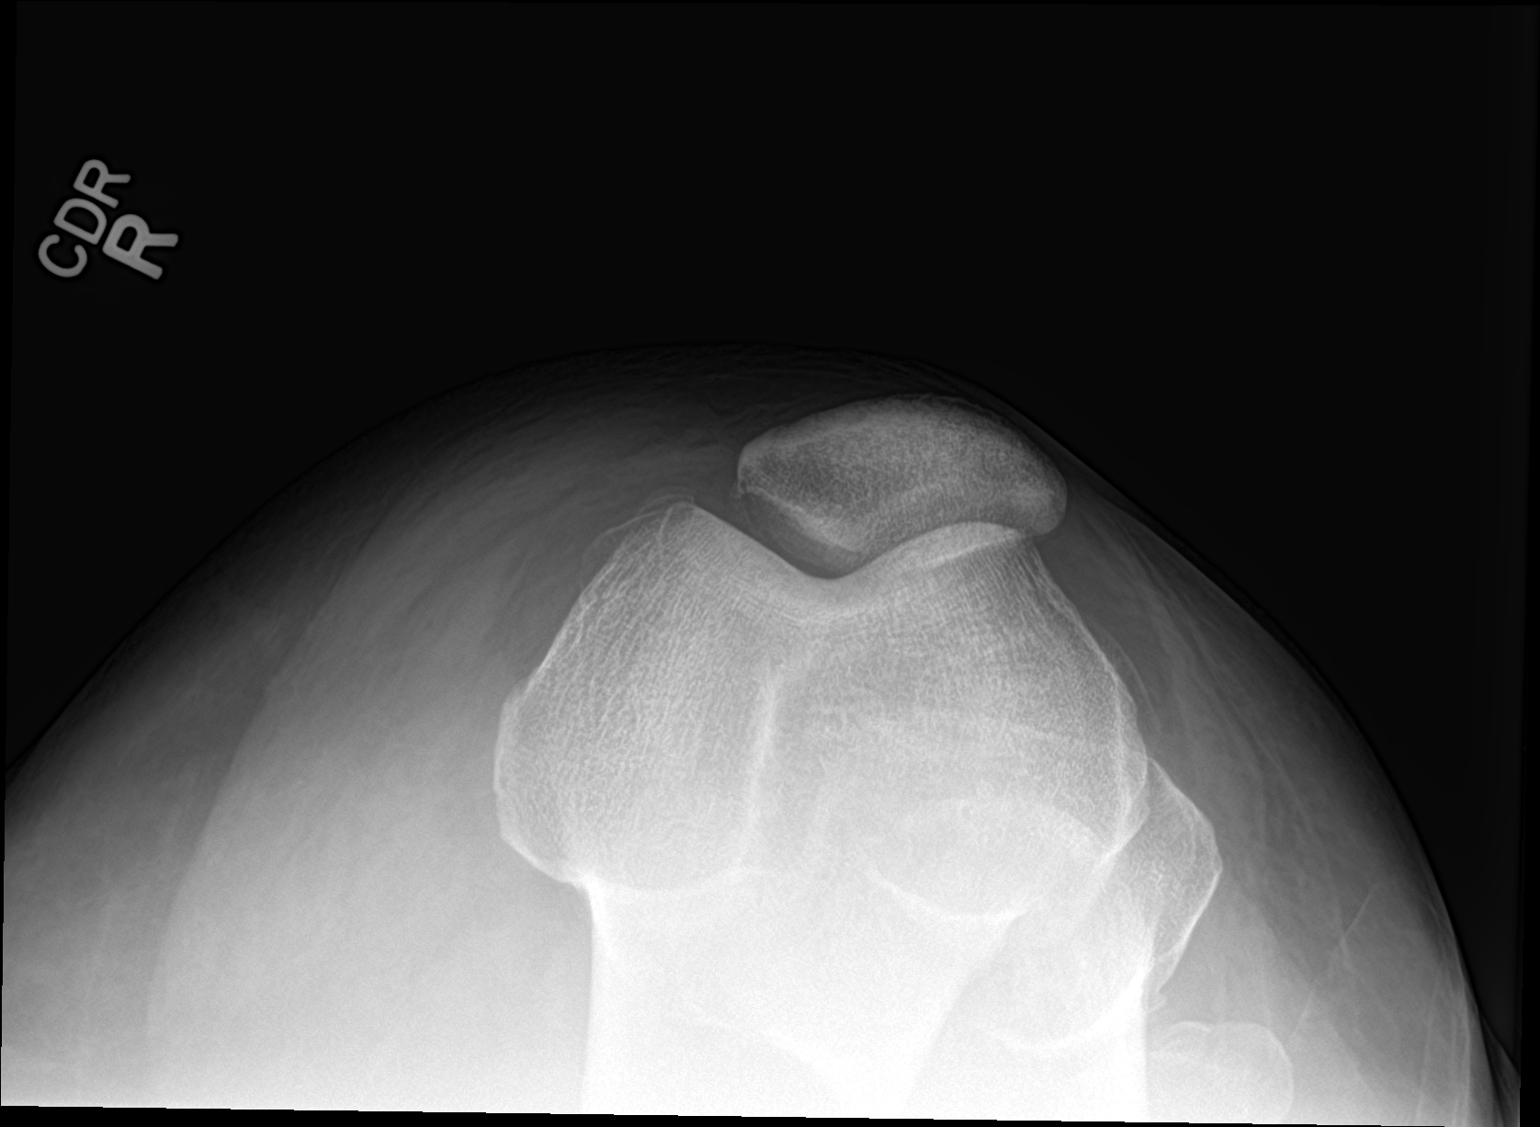

[3 of 3 positions shown; findings below may reference images not displayed]

FINDINGS: Osseous mineralization normal.

Mild lateral compartment joint space narrowing and spur formation.

No acute fracture, dislocation, or bone destruction.

No knee joint effusion.
IMPRESSION: Mild degenerative changes RIGHT knee.

No acute bony abnormalities.

## 2018-10-10 LAB — HM DIABETES EYE EXAM

## 2018-10-18 ENCOUNTER — Ambulatory Visit: Payer: BC Managed Care – PPO | Admitting: Internal Medicine

## 2018-10-25 ENCOUNTER — Other Ambulatory Visit: Payer: Self-pay

## 2018-10-25 ENCOUNTER — Ambulatory Visit (INDEPENDENT_AMBULATORY_CARE_PROVIDER_SITE_OTHER): Payer: BC Managed Care – PPO | Admitting: Internal Medicine

## 2018-10-25 ENCOUNTER — Encounter: Payer: Self-pay | Admitting: Internal Medicine

## 2018-10-25 DIAGNOSIS — F329 Major depressive disorder, single episode, unspecified: Secondary | ICD-10-CM

## 2018-10-25 DIAGNOSIS — F419 Anxiety disorder, unspecified: Secondary | ICD-10-CM

## 2018-10-25 DIAGNOSIS — F32A Depression, unspecified: Secondary | ICD-10-CM

## 2018-10-25 MED ORDER — FLUOXETINE HCL 20 MG PO TABS: ORAL_TABLET | ORAL | 1 refills | Status: DC

## 2018-10-25 NOTE — Progress Notes (Signed)
Subjective:    Patient ID: Lauren Johns, female    DOB: May 28, 1970, 48 y.o.   MRN: [id number]  DOS:  10/25/2018 Type of visit - description: Virtual Visit via Video Note  I connected with@ on 10/25/18 at  3:40 PM EDT by a video enabled telemedicine application and verified that I am speaking with the correct person using two identifiers.   THIS ENCOUNTER IS A VIRTUAL VISIT DUE TO COVID-19 - PATIENT WAS NOT SEEN IN THE OFFICE. PATIENT HAS CONSENTED TO VIRTUAL VISIT / TELEMEDICINE VISIT   Location of patient: home  Location of provider: office  I discussed the limitations of evaluation and management by telemedicine and the availability of in person appointments. The patient expressed understanding and agreed to proceed.  History of Present Illness: Acute Her main concern is anxiety. She started to feel anxious around mid June. 5 days ago went to hair salon and later on learned that the stylist was exposed to COVID-19. She got tested for COVID-19 today, results pending.  Today went to a meeting at work ---- she was tearful, could not concentrate. Because a possible covid  exposure her anxiety has really increased lately and she is ready to get some help.   Review of Systems She is able to sleep at night but has noted herself sleepy throughout the day. She has some sadness and depression as well.  No suicidal ideas.  Does feel overwhelmed sometimes by the news, workload, fear of COVID-19, etc. Denies fever chills No cough No nausea, vomiting, diarrhea  Past Medical History:  Diagnosis Date  . Abnormal brain MRI   . Allergic rhinitis 05/21/2013  . Arthritis   . Asthma   . Chronic kidney disease   . Depression    h/o   . Diabetes ([place]) 04/03/2013  . Elevated lipids   . GERD (gastroesophageal reflux disease)   . Infertility, female    h/o  . Insomnia   . Obesity   . PFO (patent foramen ovale)    ??PFO repair:  in the 90s, was seen several times after and released (per  patient)    Past Surgical History:  Procedure Laterality Date  . closed hearty valve repair  [place] , PFO repair??    Social History   Socioeconomic History  . Marital status: Married    Spouse name: Not on file  . Number of children: 0  . Years of education: Not on file  . Highest education level: Not on file  Occupational History  . Occupation: Education officer, museum -support  Social Needs  . Financial resource strain: Not on file  . Food insecurity    Worry: Not on file    Inability: Not on file  . Transportation needs    Medical: Not on file    Non-medical: Not on file  Tobacco Use  . Smoking status: Never Smoker  . Smokeless tobacco: Never Used  Substance and Sexual Activity  . Alcohol use: Yes    Comment: socially   . Drug use: No  . Sexual activity: Not on file  Lifestyle  . Physical activity    Days per week: Not on file    Minutes per session: Not on file  . Stress: Not on file  Relationships  . Social Herbalist on phone: Not on file    Gets together: Not on file    Attends religious service: Not on file    Active member of club  or organization: Not on file    Attends meetings of clubs or organizations: Not on file    Relationship status: Not on file  . Intimate partner violence    Fear of current or ex partner: Not on file    Emotionally abused: Not on file    Physically abused: Not on file    Forced sexual activity: Not on file  Other Topics Concern  . Not on file  Social History Narrative   mother is Elodia Florence, one of my patients    Husband and pt moving in w/ [place] as of 10/25/2018      Reactions   Contrave [naltrexone-bupropion Hcl Er]    Multiple s/e, hot flashes, back pain, lethargic   Gadolinium Derivatives Itching, Rash   Worsening symptoms 30 min after oral benadryl.  Pt was referred to ED by Dr. Anselm Pancoast.   Metformin And Related    Generalize itching, rash (arms)   Oxycodone-acetaminophen Nausea  And Vomiting   Penicillins Hives   Has patient had a PCN reaction causing immediate rash, facial/tongue/throat swelling, SOB or lightheadedness with hypotension:unsure Has patient had a PCN reaction causing severe rash involving mucus membranes or skin necrosis:unsure Has patient had a PCN reaction that required hospitalization:No Has patient had a PCN reaction occurring within the last 10 years:No If all of the above answers are "NO", then may proceed with Cephalosporin use. Childhood reaction   Contrast Media [iodinated Diagnostic Agents] Itching, Rash      Medication List       Accurate as of October 25, 2018  3:56 PM. If you have any questions, ask your nurse or doctor.        acetaminophen 500 MG tablet Commonly known as: TYLENOL Take 1,000 mg by mouth every 6 (six) hours as needed.   albuterol 108 (90 Base) MCG/ACT inhaler Commonly known as: Ventolin HFA Inhale 1 puff into the lungs every 6 (six) hours as needed for wheezing or shortness of breath.   albuterol (2.5 MG/3ML) 0.083% nebulizer solution Commonly known as: PROVENTIL Take 3 mLs (2.5 mg total) by nebulization every 6 (six) hours as needed for wheezing or shortness of breath.   azelastine 0.1 % nasal spray Commonly known as: ASTELIN Place 2 sprays into both nostrils at bedtime as needed for rhinitis. Use in each nostril as directed   budesonide-formoterol 160-4.5 MCG/ACT inhaler Commonly known as: SYMBICORT Inhale 2 puffs into the lungs 2 (two) times daily.   fluticasone 50 MCG/ACT nasal spray Commonly known as: FLONASE Place 2 sprays into both nostrils daily.   multivitamin with minerals Tabs tablet Take 1 tablet by mouth daily. ALIVE   predniSONE 10 MG tablet Commonly known as: DELTASONE 3 tabs x 2 days, 2 tabs x 2 days, 1 tab x 2 days           Objective:   Physical Exam There were no vitals taken for this visit. This is a virtual video visit.  The patient is alert oriented x3.  Slightly tearful  during the visit but not in acute or severe physical or emotional distress.    Assessment     Assessment   DM aic 6.6 (02-2014) Hyperlipidemia Asthma Insomnia GERD Morbid obesity  Weight: 2008 281 pounds, 2009 275 pounds,  , 10/22/2011 293  Int hemorrhoids dx 05-2015  H/o abnormal brain MRI-- pituitary adenoma used to see Dr Trenton Gammon H/o  depression Birth control --> H/o infertility  H/o close heart surgery  at [facility], PFO repair?Echo 10-2011: wnl  PLAN: Anxiety, depression: This is the first time in her life that she experienced such symptoms, they started to be noticeable about a month ago they got acutely worse in the last few days as she was possibly exposed COVID and  work is ready to start.  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 I counseled her the best I could, we discussed the tools she has to help herself: Increase physical activity, see a counselor, start medications. We agreed on SSRIs, I think fluoxetine would be a good match for her. I sent via my chart w/  instructions Next visit in 4 to 5 weeks.  Today, I spent more than 25   min with the patient: >50% of the time counseling regards new onset of anxiety, depression: Listening to her concerns, discussing medication options.   I discussed the assessment and treatment plan with the patient. The patient was provided an opportunity to ask questions and all were answered. The patient agreed with the plan and demonstrated an understanding of the instructions.   The patient was advised to call back or seek an in-person evaluation if the symptoms worsen or if the condition fails to improve as anticipated.

## 2018-10-26 MED ORDER — FLUOXETINE HCL 20 MG PO TABS: ORAL_TABLET | ORAL | 1 refills | Status: DC

## 2018-10-26 NOTE — Assessment & Plan Note (Signed)
Anxiety, depression: This is the first time in her life that she experienced such symptoms, they started to be noticeable about a month ago they got acutely worse in the last few days as she was possibly exposed COVID and  work is ready to start.  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 I counseled her the best I could, we discussed the tools she has to help herself: Increase physical activity, see a counselor, start medications. We agreed on SSRIs, I think fluoxetine would be a good match for her. I sent via my chart w/  instructions Next visit in 4 to 5 weeks.

## 2018-10-30 ENCOUNTER — Encounter: Payer: Self-pay | Admitting: Internal Medicine

## 2018-11-09 ENCOUNTER — Other Ambulatory Visit: Payer: Self-pay

## 2019-02-20 LAB — HM MAMMOGRAPHY

## 2019-02-22 ENCOUNTER — Encounter: Payer: Self-pay | Admitting: Internal Medicine

## 2019-04-27 LAB — HM COLONOSCOPY

## 2019-05-07 ENCOUNTER — Encounter: Payer: Self-pay | Admitting: Internal Medicine

## 2019-05-16 ENCOUNTER — Encounter: Payer: BC Managed Care – PPO | Admitting: Internal Medicine

## 2019-06-01 ENCOUNTER — Encounter: Payer: BC Managed Care – PPO | Admitting: Internal Medicine

## 2019-07-02 ENCOUNTER — Other Ambulatory Visit: Payer: Self-pay | Admitting: Allergy and Immunology

## 2019-07-02 ENCOUNTER — Ambulatory Visit
Admission: RE | Admit: 2019-07-02 | Discharge: 2019-07-02 | Disposition: A | Discharge status: Home / Self Care | Payer: BC Managed Care – PPO | Source: Ambulatory Visit | Attending: Allergy and Immunology | Admitting: Allergy and Immunology

## 2019-07-02 DIAGNOSIS — R059 Cough, unspecified: Secondary | ICD-10-CM

## 2019-07-02 DIAGNOSIS — R05 Cough: Secondary | ICD-10-CM

## 2019-07-05 ENCOUNTER — Other Ambulatory Visit: Payer: Self-pay | Admitting: Neurosurgery

## 2019-07-05 DIAGNOSIS — D352 Benign neoplasm of pituitary gland: Secondary | ICD-10-CM

## 2019-08-04 ENCOUNTER — Ambulatory Visit
Admission: RE | Admit: 2019-08-04 | Discharge: 2019-08-04 | Disposition: A | Discharge status: Home / Self Care | Payer: BC Managed Care – PPO | Source: Ambulatory Visit | Attending: Neurosurgery | Admitting: Neurosurgery

## 2019-08-04 ENCOUNTER — Other Ambulatory Visit: Payer: Self-pay

## 2019-08-04 DIAGNOSIS — D352 Benign neoplasm of pituitary gland: Secondary | ICD-10-CM

## 2019-08-14 ENCOUNTER — Telehealth: Payer: Self-pay

## 2019-08-14 NOTE — Telephone Encounter (Signed)
Spoke with patient after calling in her 13-hour prep to her pharmacy in [place].  She was instructed to take Prednisone 50 mg PO 08/29/19 @ 0440, 1040 and 1640; Benadryl 50 mg PO @ 1640 that day, too.

## 2019-08-29 ENCOUNTER — Ambulatory Visit
Admission: RE | Admit: 2019-08-29 | Discharge: 2019-08-29 | Disposition: A | Discharge status: Home / Self Care | Payer: BC Managed Care – PPO | Source: Ambulatory Visit | Attending: Neurosurgery | Admitting: Neurosurgery

## 2019-08-29 MED ORDER — GADOBENATE DIMEGLUMINE 529 MG/ML IV SOLN
10.0000 mL | Freq: Once | INTRAVENOUS | Status: AC | PRN
  Administered 2019-08-29: 10 mL via INTRAVENOUS

## 2019-09-14 ENCOUNTER — Encounter: Payer: BC Managed Care – PPO | Admitting: Internal Medicine

## 2019-11-01 ENCOUNTER — Encounter: Payer: Self-pay | Admitting: Internal Medicine

## 2019-11-09 ENCOUNTER — Encounter: Payer: BC Managed Care – PPO | Admitting: Internal Medicine

## 2020-06-19 ENCOUNTER — Encounter: Payer: Self-pay | Admitting: Internal Medicine

## 2020-11-11 IMAGING — CR DG CHEST 2V
2 series · 2 of 2 positions shown · non-contrast
Comparison: March 15, 2016

CLINICAL DATA: Wheezing.

EXAM:
CHEST - 2 VIEW

[w chest pa]
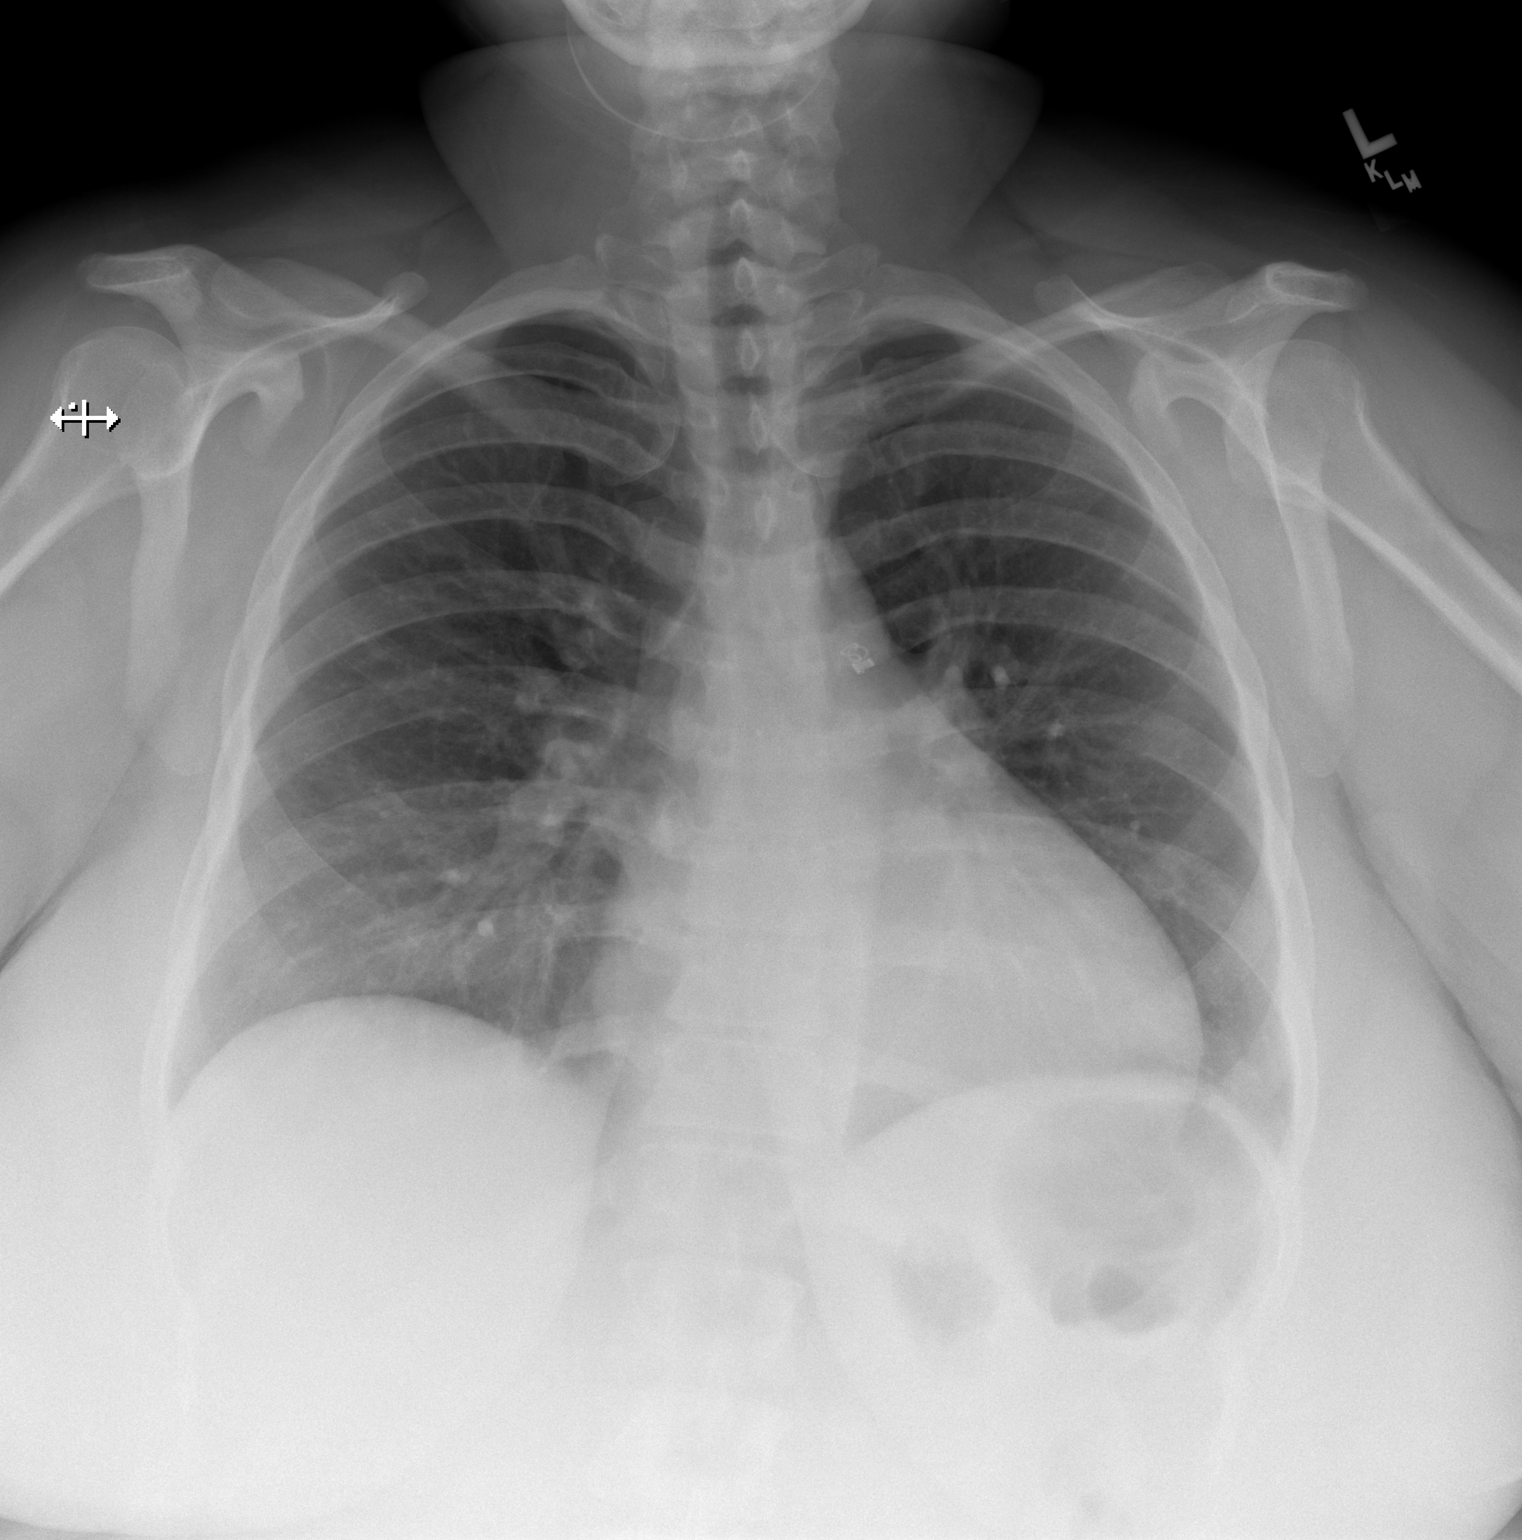

[w chest lat]
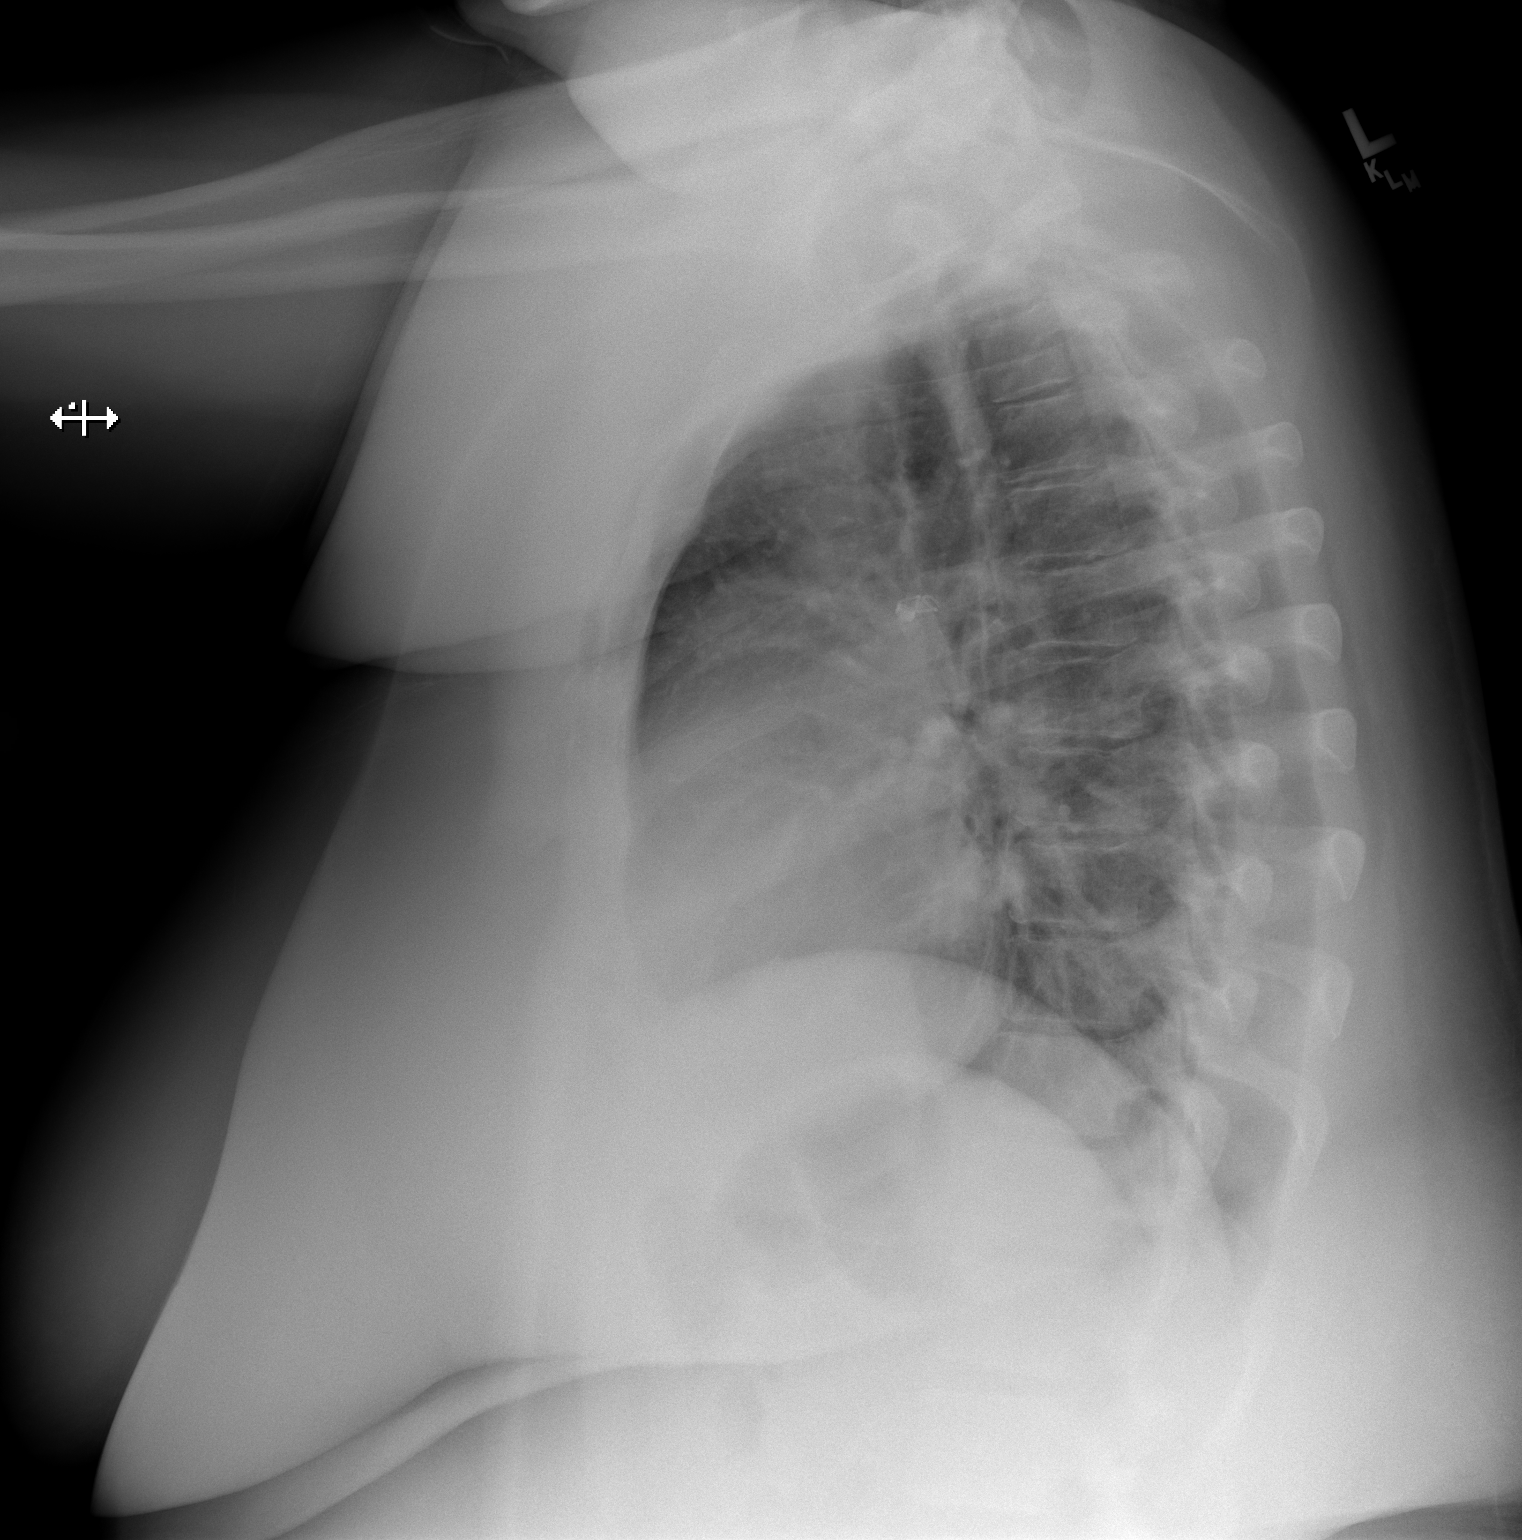

[2 of 2 positions shown; findings below may reference images not displayed]

FINDINGS: The heart size and mediastinal contours are within normal limits.
Both lungs are clear. The visualized skeletal structures are
unremarkable.
IMPRESSION: No active cardiopulmonary disease.

## 2021-08-05 ENCOUNTER — Telehealth: Payer: Self-pay

## 2021-08-05 NOTE — Telephone Encounter (Signed)
Notes scanned to referral 

## 2021-08-14 ENCOUNTER — Other Ambulatory Visit: Payer: Self-pay | Admitting: Neurosurgery

## 2021-08-14 DIAGNOSIS — D352 Benign neoplasm of pituitary gland: Secondary | ICD-10-CM

## 2021-08-20 ENCOUNTER — Encounter: Payer: Self-pay | Admitting: Internal Medicine

## 2021-08-20 ENCOUNTER — Ambulatory Visit: Payer: Self-pay

## 2021-08-20 ENCOUNTER — Ambulatory Visit (INDEPENDENT_AMBULATORY_CARE_PROVIDER_SITE_OTHER): Payer: BC Managed Care – PPO | Admitting: Internal Medicine

## 2021-08-20 VITALS — BP 134/66 | HR 55 | Ht 65.0 in | Wt 303.0 lb

## 2021-08-20 DIAGNOSIS — R002 Palpitations: Secondary | ICD-10-CM

## 2021-08-20 NOTE — Progress Notes (Signed)
?Cardiology Office Note:   ? ?Date:  08/20/2021  ? ?ID:  Lauren Johns, DOB [birth year], MRN [id number] ? ?PCP:  Katherina Mires, MD ?  ?[facility] Providers ?Cardiologist:  None    ? ?Referring MD: Avon Gully, NP  ? ?No chief complaint on file. ?Palpitations ? ?History of Present Illness:   ? ?Lauren Johns is a 51 y.o. female with a hx of DM2 (A1c 6s), PFO repair 1990s, pituitary adenoma, referral for palpitations ? ?She is currently going through perimenopausal. She gets hot flashes.She has noticed palpitations recently. She is a non smoker. She has hx of hyperlipidemia and is working on diet first. She's apprehensive about surgery for weight loss. She's on diet management for DM2. ?She notes she had itching with metformin. No pregnancies. No chest pressure or SOB. No syncope. She drinks caffeine ? ?No thyroid disease ? ?Echo in 2013 was normal. ? ?She notes PFO repair in the 1990s. No history of stroke.  ? ?Father had MI at 63. Mother has heart failure, has diabetes. ? ?Past Medical History:  ?Diagnosis Date  ? Abnormal brain MRI   ? Allergic rhinitis 05/21/2013  ? Arthritis   ? Asthma   ? Chronic kidney disease   ? Depression   ? h/o   ? Diabetes ([place]) 04/03/2013  ? Elevated lipids   ? GERD (gastroesophageal reflux disease)   ? Infertility, female   ? h/o  ? Insomnia   ? Obesity   ? PFO (patent foramen ovale)   ? ??PFO repair:  in the 90s, was seen several times after and released (per patient)  ? ? ?Past Surgical History:  ?Procedure Laterality Date  ? closed hearty valve repair  1993  ? [place] , PFO repair??  ? ? ?Current Medications: ?No outpatient medications have been marked as taking for the 08/20/21 encounter (Appointment) with Janina Mayo, MD.  ?  ? ?Allergies:   Contrave [naltrexone-bupropion hcl er], Gadolinium derivatives, Oxycodone-acetaminophen, Penicillins, Contrast media [iodinated contrast media], and Metformin and related  ? ?Social History  ? ?Socioeconomic History  ? Marital  status: Married  ?  Spouse name: Not on file  ? Number of children: 0  ? Years of education: Not on file  ? Highest education level: Not on file  ?Occupational History  ? Occupation: Education officer, museum -support  ?Tobacco Use  ? Smoking status: Never  ? Smokeless tobacco: Never  ?Substance and Sexual Activity  ? Alcohol use: Yes  ?  Comment: socially   ? Drug use: No  ? Sexual activity: Not on file  ?Other Topics Concern  ? Not on file  ?Social History Narrative  ? mother is Elodia Florence, one of my patients   ? Husband and pt moving in w/ [place]  ? ?Social Determinants of Health  ? ?Financial Resource Strain: Not on file  ?Food Insecurity: Not on file  ?Transportation Needs: Not on file  ?Physical Activity: Not on file  ?Stress: Not on file  ?Social Connections: Not on file  ?  ? ?Family History: ?The patient's family history includes Cancer in her maternal grandfather; Diabetes in her mother; Heart attack (age of onset: 11) in her father; Hypertension in her mother; Prostate cancer in her maternal grandfather. There is no history of Breast cancer or Colon cancer. ? ?ROS:   ?Please see the history of present illness.    ? All other systems reviewed and are negative. ? ?EKGs/Labs/Other Studies Reviewed:   ? ?The following  studies were reviewed today: ? ? ?EKG:  EKG is  ordered today.  The ekg ordered today demonstrates  ? ?Sinus bradycardia HR 55 bpm. ? ?Recent Labs: ?No results found for requested labs within last 8760 hours.  ?Recent Lipid Panel ?   ?Component Value Date/Time  ? CHOL 222 (H) 04/13/2018 1013  ? TRIG 115.0 04/13/2018 1013  ? HDL 42.10 04/13/2018 1013  ? CHOLHDL 5 04/13/2018 1013  ? VLDL 23.0 04/13/2018 1013  ? LDLCALC 157 (H) 04/13/2018 1013  ? LDLDIRECT 162.2 10/15/2011 0829  ? ? ? ?Risk Assessment/Calculations:   ?  ? ?    ? ?Physical Exam:   ? ?VS:  There were no vitals taken for this visit.   ? ?Wt Readings from Last 3 Encounters:  ?04/13/18 290 lb 8 oz (131.8 kg)  ?02/27/18 297 lb 6 oz (134.9 kg)   ?12/16/17 (!) 301 lb 6.4 oz (136.7 kg)  ?  ? ?GEN:  Well nourished, well developed in no acute distress, obese BMI 50 ?HEENT: Normal ?NECK: No JVD; No carotid bruits ?LYMPHATICS: No lymphadenopathy ?CARDIAC: RRR, no murmurs, rubs, gallops ?RESPIRATORY:  Clear to auscultation without rales, wheezing or rhonchi  ?ABDOMEN: Soft, non-tender, non-distended ?MUSCULOSKELETAL:  No edema; No deformity  ?SKIN: Warm and dry ?NEUROLOGIC:  Alert and oriented x 3 ?PSYCHIATRIC:  Normal affect  ? ?ASSESSMENT:   ? ?Palpitations: will get 7 day zio to determine her rhythm. Can be related to perimenopause. Not uncommon. We discussed this. Recommended cutting back on caffeine. ? ?S/p PFO repair: stable. No further work up needed. ?PLAN:   ? ?In order of problems listed above: ? ?7 day zio ?Follow up if any concerning heart rhythm issues found ? ?   ? ?   ? ? ?Medication Adjustments/Labs and Tests Ordered: ?Current medicines are reviewed at length with the patient today.  Concerns regarding medicines are outlined above.  ?No orders of the defined types were placed in this encounter. ? ?No orders of the defined types were placed in this encounter. ? ? ?There are no Patient Instructions on file for this visit.  ? ?Signed, ?Janina Mayo, MD  ?08/20/2021 8:49 AM    ?[place] ?

## 2021-08-20 NOTE — Patient Instructions (Signed)
Medication Instructions:  ?Your Physician recommend you continue on your current medication as directed.   ? ?*If you need a refill on your cardiac medications before your next appointment, please call your pharmacy* ? ? ?Lab Work: ?None ordered today ? ? ?Testing/Procedures: ?Your physician has recommended that you wear a 7 Zio monitor.  ? ?This monitor is a medical device that records the heart?s electrical activity. Doctors most often use these monitors to diagnose arrhythmias. Arrhythmias are problems with the speed or rhythm of the heartbeat. The monitor is a small device applied to your chest. You can wear one while you do your normal daily activities. While wearing this monitor if you have any symptoms to push the button and record what you felt. Once you have worn this monitor for the period of time provider prescribed (Usually 14 days), you will return the monitor device in the postage paid box. Once it is returned they will download the data collected and provide [place] with a report which the provider will then review and we will call you with those results. Important tips: ? ?Avoid showering during the first 24 hours of wearing the monitor. ?Avoid excessive sweating to help maximize wear time. ?Do not submerge the device, no hot tubs, and no swimming pools. ?Keep any lotions or oils away from the patch. ?After 24 hours you may shower with the patch on. Take brief showers with your back facing the shower head.  ?Do not remove patch once it has been placed because that will interrupt data and decrease adhesive wear time. ?Push the button when you have any symptoms and write down what you were feeling. ?Once you have completed wearing your monitor, remove and place into box which has postage paid and place in your outgoing mailbox.  ?If for some reason you have misplaced your box then call our office and we can provide another box and/or mail it off for you. ? ?  ? ? ?Follow-Up: ?At [facility], you and your  health needs are our priority.  As part of our continuing mission to provide you with exceptional heart care, we have created designated Provider Care Teams.  These Care Teams include your primary Cardiologist (physician) and Advanced Practice Providers (APPs -  Physician Assistants and Nurse Practitioners) who all work together to provide you with the care you need, when you need it. ? ?We recommend signing up for the patient portal called "MyChart".  Sign up information is provided on this After Visit Summary.  MyChart is used to connect with patients for Virtual Visits (Telemedicine).  Patients are able to view lab/test results, encounter notes, upcoming appointments, etc.  Non-urgent messages can be sent to your provider as well.   ?To learn more about what you can do with MyChart, go to [email or web address].   ? ?Your next appointment:   ?As needed ? ?The format for your next appointment:   ?In Person ? ?Provider:   ?Janina Mayo, MD { ? ? ? ?Important Information About Sugar ? ? ? ? ? ? ?

## 2021-08-20 NOTE — Progress Notes (Unsigned)
Enrolled for [employer] to mail a ZIO XT long term holter monitor to the patients address on file.  

## 2021-09-02 ENCOUNTER — Inpatient Hospital Stay: Admission: RE | Admit: 2021-09-02 | Payer: Self-pay | Source: Ambulatory Visit

## 2021-09-10 ENCOUNTER — Telehealth: Payer: Self-pay

## 2021-09-10 MED ORDER — PREDNISONE 50 MG PO TABS: ORAL_TABLET | ORAL | 0 refills | Status: AC

## 2021-09-10 NOTE — Telephone Encounter (Signed)
Phone call to patient to review instructions for 13 hr prep for MRI w/ contrast on 09/17/21 at 5:20 PM. Prescription called into [facility]. Pt aware and verbalized understanding of instructions. Prescription: 09/17/21 4:20 AM- '50mg'$  Prednisone 09/17/21 10:20 AM- '50mg'$  Prednisone 09/17/21 4: 20 PM - '50mg'$  Prednisone and '50mg'$  Benadryl   Pt reports she has benadryl at home and does not need this called in as a prescription. Pt verbalized understanding of when to take this medication. Pt also advised to have a driver the day of taking benadryl.

## 2021-09-17 ENCOUNTER — Inpatient Hospital Stay: Admission: RE | Admit: 2021-09-17 | Payer: Self-pay | Source: Ambulatory Visit

## 2021-09-29 ENCOUNTER — Inpatient Hospital Stay: Admission: RE | Admit: 2021-09-29 | Payer: Self-pay | Source: Ambulatory Visit
